# Patient Record
Sex: Female | Born: 1975 | Race: Asian | Hispanic: No | Marital: Married | Smoking: Never smoker
Health system: Southern US, Community
[De-identification: ages and names within clinical notes are randomized; demographics above are authoritative.]

## PROBLEM LIST (undated history)

## (undated) DIAGNOSIS — F419 Anxiety disorder, unspecified: Secondary | ICD-10-CM

## (undated) DIAGNOSIS — R519 Headache, unspecified: Secondary | ICD-10-CM

## (undated) DIAGNOSIS — E559 Vitamin D deficiency, unspecified: Secondary | ICD-10-CM

## (undated) DIAGNOSIS — U071 COVID-19: Secondary | ICD-10-CM

## (undated) DIAGNOSIS — R202 Paresthesia of skin: Secondary | ICD-10-CM

## (undated) DIAGNOSIS — K219 Gastro-esophageal reflux disease without esophagitis: Secondary | ICD-10-CM

## (undated) DIAGNOSIS — R1310 Dysphagia, unspecified: Secondary | ICD-10-CM

## (undated) DIAGNOSIS — L309 Dermatitis, unspecified: Secondary | ICD-10-CM

## (undated) DIAGNOSIS — E785 Hyperlipidemia, unspecified: Secondary | ICD-10-CM

## (undated) HISTORY — DX: Dermatitis, unspecified: L30.9

## (undated) HISTORY — DX: Hyperlipidemia, unspecified: E78.5

## (undated) HISTORY — DX: Vitamin D deficiency, unspecified: E55.9

## (undated) HISTORY — DX: Paresthesia of skin: R20.2

## (undated) HISTORY — DX: Anxiety disorder, unspecified: F41.9

## (undated) HISTORY — DX: COVID-19: U07.1

## (undated) HISTORY — DX: Dysphagia, unspecified: R13.10

## (undated) HISTORY — DX: Headache, unspecified: R51.9

---

## 1998-02-03 ENCOUNTER — Inpatient Hospital Stay (HOSPITAL_COMMUNITY): Admission: AD | Admit: 1998-02-03 | Discharge: 1998-02-03 | Payer: Self-pay | Admitting: *Deleted

## 1998-02-05 ENCOUNTER — Inpatient Hospital Stay (HOSPITAL_COMMUNITY): Admission: AD | Admit: 1998-02-05 | Discharge: 1998-02-05 | Payer: Self-pay | Admitting: Obstetrics and Gynecology

## 1998-02-22 ENCOUNTER — Inpatient Hospital Stay (HOSPITAL_COMMUNITY): Admission: AD | Admit: 1998-02-22 | Discharge: 1998-02-24 | Payer: Self-pay | Admitting: *Deleted

## 2000-01-25 ENCOUNTER — Encounter: Payer: Self-pay | Admitting: Family Medicine

## 2000-01-25 ENCOUNTER — Ambulatory Visit (HOSPITAL_COMMUNITY): Admission: RE | Admit: 2000-01-25 | Discharge: 2000-01-25 | Payer: Self-pay | Admitting: Family Medicine

## 2000-12-20 DIAGNOSIS — Z9289 Personal history of other medical treatment: Secondary | ICD-10-CM

## 2000-12-20 HISTORY — DX: Personal history of other medical treatment: Z92.89

## 2003-12-21 HISTORY — PX: LASIK: SHX215

## 2006-08-26 ENCOUNTER — Other Ambulatory Visit: Admission: RE | Admit: 2006-08-26 | Discharge: 2006-08-26 | Payer: Self-pay | Admitting: Family Medicine

## 2008-03-05 ENCOUNTER — Observation Stay (HOSPITAL_COMMUNITY): Admission: AD | Admit: 2008-03-05 | Discharge: 2008-03-06 | Payer: Self-pay | Admitting: Obstetrics and Gynecology

## 2008-04-22 ENCOUNTER — Inpatient Hospital Stay (HOSPITAL_COMMUNITY): Admission: AD | Admit: 2008-04-22 | Discharge: 2008-04-24 | Payer: Self-pay | Admitting: Obstetrics and Gynecology

## 2009-08-15 ENCOUNTER — Other Ambulatory Visit: Admission: RE | Admit: 2009-08-15 | Discharge: 2009-08-15 | Payer: Self-pay | Admitting: Family Medicine

## 2010-01-12 ENCOUNTER — Encounter: Admission: RE | Admit: 2010-01-12 | Discharge: 2010-01-12 | Payer: Self-pay | Admitting: Family Medicine

## 2011-01-10 ENCOUNTER — Encounter: Payer: Self-pay | Admitting: Obstetrics and Gynecology

## 2011-05-04 NOTE — Discharge Summary (Signed)
NAME:  Pamela Moreno, Pamela Moreno                     ACCOUNT NO.:  [id number]   MEDICAL RECORD NO.:  [id number]          PATIENT TYPE:  INP   LOCATION:  9131                          FACILITY:  [facility]   PHYSICIAN:  Zenaida Niece, M.D.DATE OF BIRTH:  1976-08-14   DATE OF ADMISSION:  04/22/2008  DATE OF DISCHARGE:  04/24/2008                               DISCHARGE SUMMARY   ADMISSION DIAGNOSIS:  Intrauterine pregnancy at 38 weeks.   DISCHARGE DIAGNOSIS:  Intrauterine pregnancy at 38 weeks.   PROCEDURES:  On Apr 22, 2008, she had a spontaneous vaginal delivery.   HISTORY AND PHYSICAL:  This is a 35 year old Asian female gravida 3,  para 2-0-0-2 with an EGA of 38 plus weeks who presents with a complaint  of regular contractions.  Evaluation in the office revealed cervix to be  4-5, 80, -2.  Prenatal care complicated by preterm dilation and  contractions, treated with intermittent Procardia.  She also wants  either tubal ligation or Essure.   PRENATAL LABS:  Blood type is O positive with a negative antibody  screen, RPR nonreactive, rubella immune, hepatitis B surface antigen  negative, HIV negative, gonorrhea and chlamydia negative, 1-hour Glucola  94, and group B strep is negative.   PAST OB HISTORY:  Two vaginal deliveries at term with rapid labors.  Baby's weight is 6 pounds 12 ounces and 6 pounds 9 ounces.   PAST MEDICAL AND SURGICAL HISTORY:  Negative.   ALLERGY:  To SULFA and possibly to LATEX.   PHYSICAL EXAM:  VITAL SIGNS: She is afebrile with stable vital signs.  Fetal heart tracing reactive with contractions every 3-5 minutes.  ABDOMEN: Gravid, nontender with an estimated fetal weight of 7 pounds.  Cervix is 5, 80, -1 vertex presentation.  Adequate pelvis and membranes  were ruptured revealing clear fluid.   HOSPITAL COURSE:  The patient was admitted and continued to contract on  her own.  Membranes were ruptured for augmentation.  She eventually  progressed to complete, pushed well,  and on the evening of Apr 22, 2008,  had a vaginal delivery of a viable female infant with Apgars of 9 and 9,  weight 6 pounds 11 ounces.  The baby delivered through a loose nuchal  cord x1 over a second-degree midline episiotomy.  Placenta delivered  spontaneously was intact.  Second-degree episiotomy repaired with 3-0  Vicryl.  Estimated blood loss 500 mL.  She did initially want to proceed  with tubal ligation on postpartum day #1.  She had no significant  postpartum problems.  Predelivery hemoglobin 13.2 and postdelivery is  12.2.  Due to difficulties in scheduling the tubal, she decided to wait  until after her postpartum visit and have Essure done in the office.  She had no further complications and on postpartum day #2 was felt to be  stable enough for discharge home.   DISCHARGE INSTRUCTIONS:  Regular diet, pelvic rest, followup in 6 weeks.   MEDICATIONS:  Over-the-counter ibuprofen and Tylenol p.r.n. and she is  given our discharge pamphlet.      Leighton Roach.  [place]isinger, M.D.  Electronically Signed     TDM/MEDQ  D:  04/24/2008  T:  04/24/2008  Job:  [id number]

## 2011-09-13 LAB — STREP B DNA PROBE: Strep Group B Ag: NEGATIVE

## 2011-09-13 LAB — CBC
HCT: 36.4
Hemoglobin: 12.8
MCHC: 35.1
MCV: 91.9
Platelets: 134 — ABNORMAL LOW
RBC: 3.96
RDW: 13.6
WBC: 14.8 — ABNORMAL HIGH

## 2011-09-13 LAB — RPR: RPR Ser Ql: NONREACTIVE

## 2014-06-18 ENCOUNTER — Other Ambulatory Visit: Payer: Self-pay | Admitting: Family Medicine

## 2014-06-18 ENCOUNTER — Other Ambulatory Visit (HOSPITAL_COMMUNITY)
Admission: RE | Admit: 2014-06-18 | Discharge: 2014-06-18 | Disposition: A | Discharge status: Home / Self Care | Payer: BC Managed Care – PPO | Source: Ambulatory Visit | Attending: Family Medicine | Admitting: Family Medicine

## 2014-06-18 DIAGNOSIS — Z0000 Encounter for general adult medical examination without abnormal findings: Secondary | ICD-10-CM | POA: Insufficient documentation

## 2014-06-19 LAB — CYTOLOGY - PAP

## 2015-01-06 ENCOUNTER — Ambulatory Visit
Admission: RE | Admit: 2015-01-06 | Discharge: 2015-01-06 | Disposition: A | Discharge status: Home / Self Care | Payer: BLUE CROSS/BLUE SHIELD | Source: Ambulatory Visit | Attending: Family Medicine | Admitting: Family Medicine

## 2015-01-06 ENCOUNTER — Other Ambulatory Visit: Payer: Self-pay | Admitting: Family Medicine

## 2015-01-06 DIAGNOSIS — R7611 Nonspecific reaction to tuberculin skin test without active tuberculosis: Secondary | ICD-10-CM

## 2015-01-06 DIAGNOSIS — R059 Cough, unspecified: Secondary | ICD-10-CM

## 2015-01-06 DIAGNOSIS — R05 Cough: Secondary | ICD-10-CM

## 2016-02-16 ENCOUNTER — Other Ambulatory Visit: Payer: Self-pay | Admitting: Family Medicine

## 2016-02-16 ENCOUNTER — Ambulatory Visit
Admission: RE | Admit: 2016-02-16 | Discharge: 2016-02-16 | Disposition: A | Discharge status: Home / Self Care | Payer: BLUE CROSS/BLUE SHIELD | Source: Ambulatory Visit | Attending: Family Medicine | Admitting: Family Medicine

## 2016-02-16 DIAGNOSIS — R05 Cough: Secondary | ICD-10-CM

## 2016-02-16 DIAGNOSIS — R059 Cough, unspecified: Secondary | ICD-10-CM

## 2016-03-24 DIAGNOSIS — J3089 Other allergic rhinitis: Secondary | ICD-10-CM | POA: Diagnosis not present

## 2016-03-24 DIAGNOSIS — K219 Gastro-esophageal reflux disease without esophagitis: Secondary | ICD-10-CM | POA: Diagnosis not present

## 2016-03-24 DIAGNOSIS — L509 Urticaria, unspecified: Secondary | ICD-10-CM | POA: Diagnosis not present

## 2016-03-24 DIAGNOSIS — J3081 Allergic rhinitis due to animal (cat) (dog) hair and dander: Secondary | ICD-10-CM | POA: Diagnosis not present

## 2016-03-24 DIAGNOSIS — J301 Allergic rhinitis due to pollen: Secondary | ICD-10-CM | POA: Diagnosis not present

## 2016-03-24 DIAGNOSIS — J309 Allergic rhinitis, unspecified: Secondary | ICD-10-CM | POA: Diagnosis not present

## 2016-03-24 DIAGNOSIS — R05 Cough: Secondary | ICD-10-CM | POA: Diagnosis not present

## 2016-03-30 DIAGNOSIS — J301 Allergic rhinitis due to pollen: Secondary | ICD-10-CM | POA: Diagnosis not present

## 2016-03-31 DIAGNOSIS — J3081 Allergic rhinitis due to animal (cat) (dog) hair and dander: Secondary | ICD-10-CM | POA: Diagnosis not present

## 2016-03-31 DIAGNOSIS — J3089 Other allergic rhinitis: Secondary | ICD-10-CM | POA: Diagnosis not present

## 2016-04-27 DIAGNOSIS — Z79899 Other long term (current) drug therapy: Secondary | ICD-10-CM | POA: Diagnosis not present

## 2016-04-27 DIAGNOSIS — K219 Gastro-esophageal reflux disease without esophagitis: Secondary | ICD-10-CM | POA: Diagnosis not present

## 2016-04-27 DIAGNOSIS — Z13220 Encounter for screening for lipoid disorders: Secondary | ICD-10-CM | POA: Diagnosis not present

## 2016-04-27 DIAGNOSIS — E559 Vitamin D deficiency, unspecified: Secondary | ICD-10-CM | POA: Diagnosis not present

## 2016-04-27 DIAGNOSIS — Z0000 Encounter for general adult medical examination without abnormal findings: Secondary | ICD-10-CM | POA: Diagnosis not present

## 2016-07-14 DIAGNOSIS — R6884 Jaw pain: Secondary | ICD-10-CM | POA: Diagnosis not present

## 2016-11-01 DIAGNOSIS — K219 Gastro-esophageal reflux disease without esophagitis: Secondary | ICD-10-CM | POA: Diagnosis not present

## 2016-11-01 DIAGNOSIS — L509 Urticaria, unspecified: Secondary | ICD-10-CM | POA: Diagnosis not present

## 2016-11-01 DIAGNOSIS — H1045 Other chronic allergic conjunctivitis: Secondary | ICD-10-CM | POA: Diagnosis not present

## 2016-11-01 DIAGNOSIS — R05 Cough: Secondary | ICD-10-CM | POA: Diagnosis not present

## 2016-11-05 DIAGNOSIS — Z6830 Body mass index (BMI) 30.0-30.9, adult: Secondary | ICD-10-CM | POA: Diagnosis not present

## 2016-11-05 DIAGNOSIS — Z23 Encounter for immunization: Secondary | ICD-10-CM | POA: Diagnosis not present

## 2016-11-05 DIAGNOSIS — Z1231 Encounter for screening mammogram for malignant neoplasm of breast: Secondary | ICD-10-CM | POA: Diagnosis not present

## 2016-11-05 DIAGNOSIS — Z01419 Encounter for gynecological examination (general) (routine) without abnormal findings: Secondary | ICD-10-CM | POA: Diagnosis not present

## 2016-11-30 DIAGNOSIS — J029 Acute pharyngitis, unspecified: Secondary | ICD-10-CM | POA: Diagnosis not present

## 2016-12-20 HISTORY — PX: OTHER SURGICAL HISTORY: SHX169

## 2016-12-28 DIAGNOSIS — T887XXA Unspecified adverse effect of drug or medicament, initial encounter: Secondary | ICD-10-CM | POA: Diagnosis not present

## 2016-12-28 DIAGNOSIS — R21 Rash and other nonspecific skin eruption: Secondary | ICD-10-CM | POA: Diagnosis not present

## 2017-01-13 DIAGNOSIS — R300 Dysuria: Secondary | ICD-10-CM | POA: Diagnosis not present

## 2017-07-22 DIAGNOSIS — L237 Allergic contact dermatitis due to plants, except food: Secondary | ICD-10-CM | POA: Diagnosis not present

## 2017-10-31 DIAGNOSIS — R51 Headache: Secondary | ICD-10-CM | POA: Diagnosis not present

## 2017-10-31 DIAGNOSIS — R350 Frequency of micturition: Secondary | ICD-10-CM | POA: Diagnosis not present

## 2017-10-31 DIAGNOSIS — H539 Unspecified visual disturbance: Secondary | ICD-10-CM | POA: Diagnosis not present

## 2017-10-31 DIAGNOSIS — M542 Cervicalgia: Secondary | ICD-10-CM | POA: Diagnosis not present

## 2017-10-31 DIAGNOSIS — Z23 Encounter for immunization: Secondary | ICD-10-CM | POA: Diagnosis not present

## 2017-11-01 ENCOUNTER — Ambulatory Visit
Admission: RE | Admit: 2017-11-01 | Discharge: 2017-11-01 | Disposition: A | Discharge status: Home / Self Care | Payer: BLUE CROSS/BLUE SHIELD | Source: Ambulatory Visit | Attending: Family Medicine | Admitting: Family Medicine

## 2017-11-01 ENCOUNTER — Other Ambulatory Visit: Payer: Self-pay | Admitting: Family Medicine

## 2017-11-01 DIAGNOSIS — M542 Cervicalgia: Secondary | ICD-10-CM

## 2017-11-01 DIAGNOSIS — M50323 Other cervical disc degeneration at C6-C7 level: Secondary | ICD-10-CM | POA: Diagnosis not present

## 2017-11-08 DIAGNOSIS — K219 Gastro-esophageal reflux disease without esophagitis: Secondary | ICD-10-CM | POA: Diagnosis not present

## 2017-11-08 DIAGNOSIS — R102 Pelvic and perineal pain: Secondary | ICD-10-CM | POA: Diagnosis not present

## 2017-11-08 DIAGNOSIS — M545 Low back pain: Secondary | ICD-10-CM | POA: Diagnosis not present

## 2018-01-19 DIAGNOSIS — H52203 Unspecified astigmatism, bilateral: Secondary | ICD-10-CM | POA: Diagnosis not present

## 2018-01-19 DIAGNOSIS — H5213 Myopia, bilateral: Secondary | ICD-10-CM | POA: Diagnosis not present

## 2018-01-19 DIAGNOSIS — H524 Presbyopia: Secondary | ICD-10-CM | POA: Diagnosis not present

## 2018-02-22 DIAGNOSIS — Z6832 Body mass index (BMI) 32.0-32.9, adult: Secondary | ICD-10-CM | POA: Diagnosis not present

## 2018-02-22 DIAGNOSIS — Z01419 Encounter for gynecological examination (general) (routine) without abnormal findings: Secondary | ICD-10-CM | POA: Diagnosis not present

## 2018-02-22 DIAGNOSIS — Z1231 Encounter for screening mammogram for malignant neoplasm of breast: Secondary | ICD-10-CM | POA: Diagnosis not present

## 2018-04-04 ENCOUNTER — Encounter (HOSPITAL_COMMUNITY): Payer: Self-pay | Admitting: Emergency Medicine

## 2018-04-04 ENCOUNTER — Ambulatory Visit (HOSPITAL_COMMUNITY)
Admission: EM | Admit: 2018-04-04 | Discharge: 2018-04-04 | Disposition: A | Discharge status: Home / Self Care | Payer: BLUE CROSS/BLUE SHIELD | Attending: Family Medicine | Admitting: Family Medicine

## 2018-04-04 DIAGNOSIS — J029 Acute pharyngitis, unspecified: Secondary | ICD-10-CM

## 2018-04-04 DIAGNOSIS — R0989 Other specified symptoms and signs involving the circulatory and respiratory systems: Secondary | ICD-10-CM

## 2018-04-04 DIAGNOSIS — R0789 Other chest pain: Secondary | ICD-10-CM

## 2018-04-04 DIAGNOSIS — F458 Other somatoform disorders: Secondary | ICD-10-CM

## 2018-04-04 DIAGNOSIS — R198 Other specified symptoms and signs involving the digestive system and abdomen: Secondary | ICD-10-CM

## 2018-04-04 MED ORDER — GI COCKTAIL ~~LOC~~
ORAL | Status: AC
  Filled 2018-04-04: qty 30

## 2018-04-04 MED ORDER — GI COCKTAIL ~~LOC~~
30.0000 mL | Freq: Once | ORAL | Status: AC
  Administered 2018-04-04: 30 mL via ORAL

## 2018-04-04 MED ORDER — ESOMEPRAZOLE MAGNESIUM 40 MG PO CPDR: 40.0000 mg | DELAYED_RELEASE_CAPSULE | Freq: Every day | ORAL | 0 refills | Status: DC

## 2018-04-04 NOTE — ED Triage Notes (Signed)
Pt sts chest tightness and sore throat; pt sts hx of GERD and unsure if same

## 2018-04-04 NOTE — ED Provider Notes (Signed)
[facility]    CSN: [id number] Arrival date & time: 04/04/18  1743     History   Chief Complaint Chief Complaint  Patient presents with  . Chest Pain  . Sore Throat    HPI Pamela Moreno is a 42 y.o. female.   42 year old female comes in for 1 week history of chest tightness and globus sensation.  Chest tightness is constant, worse with lying down.  Denies shortness of breath, wheezing, palpitations.  Globus sensation is also constant, no aggravating or alleviating factor.   Denies nausea, vomiting, abdominal pain.  Denies URI symptoms such as cough, congestion, throat pain.  Denies fever, chills, night sweats.  Denies swelling of the throat, trouble breathing, trouble swallowing.  Has a history of GERD, takes pantoprazole for many years.  States she felt her symptoms started due to drinking caffeine, and started caffeine without improvement.  She has still been eating and drinking without problems, does eat spicy foods, but denies any increase in symptoms after spicy food.  Denies increase in symptoms with exertion.  Has not noticed DOE, or having to take breaks with exertion.  Denies personal history of HTN, DM, heart disease, asthma.  Never smoker.  Denies family history of heart disease.      History reviewed. No pertinent past medical history.  There are no active problems to display for this patient.   History reviewed. No pertinent surgical history.  OB History   None      Home Medications    Prior to Admission medications   Medication Sig Start Date End Date Taking? Authorizing Provider  esomeprazole (NEXIUM) 40 MG capsule Take 1 capsule (40 mg total) by mouth daily. 04/04/18   Belinda Fisher, PA-C    Family History History reviewed. No pertinent family history.  Social History Social History   Tobacco Use  . Smoking status: Never Smoker  . Smokeless tobacco: Never Used  Substance Use Topics  . Alcohol use: Never    Frequency: Never  . Drug use: Never       Allergies   Patient has no known allergies.   Review of Systems Review of Systems  Reason unable to perform ROS: See HPI as above.     Physical Exam Triage Vital Signs ED Triage Vitals [04/04/18 1804]  Enc Vitals Group     BP 123/76     Pulse Rate 64     Resp 18     Temp 98.4 F (36.9 C)     Temp Source Oral     SpO2 98 %     Weight      Height      Head Circumference      Peak Flow      Pain Score      Pain Loc      Pain Edu?      Excl. in GC?    No data found.  Updated Vital Signs BP 123/76 (BP Location: Right Arm)   Pulse 64   Temp 98.4 F (36.9 C) (Oral)   Resp 18   SpO2 98%   Physical Exam  Constitutional: She is oriented to person, place, and time. She appears well-developed and well-nourished.  Non-toxic appearance. She does not appear ill. No distress.  HENT:  Head: Normocephalic and atraumatic.  Right Ear: Tympanic membrane, external ear and ear canal normal. Tympanic membrane is not erythematous and not bulging.  Left Ear: Tympanic membrane, external ear and ear canal normal. Tympanic membrane  is not erythematous and not bulging.  Nose: Nose normal. Right sinus exhibits no maxillary sinus tenderness and no frontal sinus tenderness. Left sinus exhibits no maxillary sinus tenderness and no frontal sinus tenderness.  Mouth/Throat: Uvula is midline, oropharynx is clear and moist and mucous membranes are normal.  Eyes: Pupils are equal, round, and reactive to light. Conjunctivae are normal.  Neck: Normal range of motion. Neck supple. No tracheal deviation present. No thyromegaly present.  Cardiovascular: Normal rate, regular rhythm and normal heart sounds. Exam reveals no gallop and no friction rub.  No murmur heard. Pulmonary/Chest: Effort normal and breath sounds normal. No accessory muscle usage or stridor. No tachypnea. No respiratory distress. She has no decreased breath sounds. She has no wheezes. She has no rhonchi. She has no rales.   Abdominal: Soft. Bowel sounds are normal. She exhibits no distension. There is no tenderness. There is no rebound and no guarding.  Musculoskeletal:       Right lower leg: She exhibits no edema.       Left lower leg: She exhibits no edema.  Lymphadenopathy:    She has no cervical adenopathy.  Neurological: She is alert and oriented to person, place, and time. She is not disoriented.  Skin: Skin is warm and dry.  Psychiatric: She has a normal mood and affect. Her behavior is normal. Judgment normal. Her mood appears not anxious. She is not agitated.     UC Treatments / Results  Labs (all labs ordered are listed, but only abnormal results are displayed) Labs Reviewed - No data to display  EKG None Radiology No results found.  Procedures Procedures (including critical care time)  Medications Ordered in UC Medications  gi cocktail (Maalox,Lidocaine,Donnatal) (30 mLs Oral Given 04/04/18 1847)     Initial Impression / Assessment and Plan / UC Course  I have reviewed the triage vital signs and the nursing notes.  Pertinent labs & imaging results that were available during my care of the patient were reviewed by me and considered in my medical decision making (see chart for details).    Patient with improvement of globus sensation after GI cocktail. EKG NSR, 67 bpm, without acute ST changes. Lungs clear to auscultation bilaterally without adventitious lung sounds. Will switch patient to nexium, foods to avoid for GERD provided to patient. Patient to follow up with PCP for further evaluation if symptoms not improving. Return precautions given. Patient expresses understanding and agrees to plan.   Final Clinical Impressions(s) / UC Diagnoses   Final diagnoses:  Globus sensation    ED Discharge Orders        Ordered    esomeprazole (NEXIUM) 40 MG capsule  Daily     04/04/18 1901        Belinda FisherYu, Amy V, PA-C 04/04/18 1909

## 2018-04-04 NOTE — Discharge Instructions (Signed)
EKG normal. Discontinue pantoprazole for now. Start nexium as directed.  I have attached information on foods to avoid for now.  Avoid spicy food and caffeine.  Sit up straight 30-60 minutes after eating.  Do not eat 3 hours prior to sleeping.  Follow-up with PCP for further evaluation if symptoms not improving.  If experiencing worsening in symptoms, chest pain, shortness of breath, wheezing, weakness, dizziness, passing out, go to the emergency department for further evaluation.

## 2018-05-22 DIAGNOSIS — Z79899 Other long term (current) drug therapy: Secondary | ICD-10-CM | POA: Diagnosis not present

## 2018-05-22 DIAGNOSIS — Z0000 Encounter for general adult medical examination without abnormal findings: Secondary | ICD-10-CM | POA: Diagnosis not present

## 2018-05-22 DIAGNOSIS — E559 Vitamin D deficiency, unspecified: Secondary | ICD-10-CM | POA: Diagnosis not present

## 2018-05-22 DIAGNOSIS — Z13220 Encounter for screening for lipoid disorders: Secondary | ICD-10-CM | POA: Diagnosis not present

## 2018-05-22 DIAGNOSIS — K219 Gastro-esophageal reflux disease without esophagitis: Secondary | ICD-10-CM | POA: Diagnosis not present

## 2018-06-19 ENCOUNTER — Other Ambulatory Visit: Payer: Self-pay | Admitting: Gastroenterology

## 2018-06-19 DIAGNOSIS — R1310 Dysphagia, unspecified: Secondary | ICD-10-CM | POA: Diagnosis not present

## 2018-06-19 DIAGNOSIS — K219 Gastro-esophageal reflux disease without esophagitis: Secondary | ICD-10-CM | POA: Diagnosis not present

## 2018-06-23 ENCOUNTER — Ambulatory Visit
Admission: RE | Admit: 2018-06-23 | Discharge: 2018-06-23 | Disposition: A | Discharge status: Home / Self Care | Payer: BLUE CROSS/BLUE SHIELD | Source: Ambulatory Visit | Attending: Gastroenterology | Admitting: Gastroenterology

## 2018-06-23 DIAGNOSIS — R1314 Dysphagia, pharyngoesophageal phase: Secondary | ICD-10-CM | POA: Diagnosis not present

## 2018-06-23 DIAGNOSIS — R1310 Dysphagia, unspecified: Secondary | ICD-10-CM

## 2018-06-23 DIAGNOSIS — K449 Diaphragmatic hernia without obstruction or gangrene: Secondary | ICD-10-CM | POA: Diagnosis not present

## 2018-07-27 DIAGNOSIS — R1310 Dysphagia, unspecified: Secondary | ICD-10-CM | POA: Diagnosis not present

## 2018-07-27 DIAGNOSIS — K219 Gastro-esophageal reflux disease without esophagitis: Secondary | ICD-10-CM | POA: Diagnosis not present

## 2018-10-16 DIAGNOSIS — M7711 Lateral epicondylitis, right elbow: Secondary | ICD-10-CM | POA: Diagnosis not present

## 2018-10-16 DIAGNOSIS — Z23 Encounter for immunization: Secondary | ICD-10-CM | POA: Diagnosis not present

## 2018-12-11 DIAGNOSIS — M7711 Lateral epicondylitis, right elbow: Secondary | ICD-10-CM | POA: Diagnosis not present

## 2019-01-01 DIAGNOSIS — M25521 Pain in right elbow: Secondary | ICD-10-CM | POA: Diagnosis not present

## 2019-03-14 DIAGNOSIS — R42 Dizziness and giddiness: Secondary | ICD-10-CM | POA: Diagnosis not present

## 2019-03-14 DIAGNOSIS — J301 Allergic rhinitis due to pollen: Secondary | ICD-10-CM | POA: Diagnosis not present

## 2019-04-19 DIAGNOSIS — Z6831 Body mass index (BMI) 31.0-31.9, adult: Secondary | ICD-10-CM | POA: Diagnosis not present

## 2019-04-19 DIAGNOSIS — Z01419 Encounter for gynecological examination (general) (routine) without abnormal findings: Secondary | ICD-10-CM | POA: Diagnosis not present

## 2019-04-19 DIAGNOSIS — Z1231 Encounter for screening mammogram for malignant neoplasm of breast: Secondary | ICD-10-CM | POA: Diagnosis not present

## 2019-05-07 DIAGNOSIS — E669 Obesity, unspecified: Secondary | ICD-10-CM | POA: Diagnosis not present

## 2019-05-07 DIAGNOSIS — R300 Dysuria: Secondary | ICD-10-CM | POA: Diagnosis not present

## 2019-05-07 DIAGNOSIS — M25561 Pain in right knee: Secondary | ICD-10-CM | POA: Diagnosis not present

## 2019-05-07 DIAGNOSIS — N3090 Cystitis, unspecified without hematuria: Secondary | ICD-10-CM | POA: Diagnosis not present

## 2019-10-01 ENCOUNTER — Other Ambulatory Visit: Payer: Self-pay

## 2019-10-01 ENCOUNTER — Ambulatory Visit
Admission: EM | Admit: 2019-10-01 | Discharge: 2019-10-01 | Disposition: A | Discharge status: Home / Self Care | Payer: BC Managed Care – PPO | Attending: Emergency Medicine | Admitting: Emergency Medicine

## 2019-10-01 ENCOUNTER — Encounter: Payer: Self-pay | Admitting: Emergency Medicine

## 2019-10-01 DIAGNOSIS — K449 Diaphragmatic hernia without obstruction or gangrene: Secondary | ICD-10-CM | POA: Diagnosis not present

## 2019-10-01 DIAGNOSIS — K219 Gastro-esophageal reflux disease without esophagitis: Secondary | ICD-10-CM | POA: Diagnosis not present

## 2019-10-01 HISTORY — DX: Gastro-esophageal reflux disease without esophagitis: K21.9

## 2019-10-01 MED ORDER — ALUM & MAG HYDROXIDE-SIMETH 200-200-20 MG/5ML PO SUSP
30.0000 mL | Freq: Once | ORAL | Status: AC
  Administered 2019-10-01: 30 mL via ORAL

## 2019-10-01 MED ORDER — LIDOCAINE VISCOUS HCL 2 % MT SOLN
15.0000 mL | Freq: Once | OROMUCOSAL | Status: AC
  Administered 2019-10-01: 15 mL via ORAL

## 2019-10-01 NOTE — ED Provider Notes (Signed)
[facility]    CSN: [id number] Arrival date & time: 10/01/19  1155      History   Chief Complaint Chief Complaint  Patient presents with  . Chest Pain    HPI Pamela Moreno is a 43 y.o. female with history of GERD presenting for 1 week course of intermittent retrosternal chest pain, one episode of nausea without emesis.  Patient also notes infrequent dizziness that is worse with standing.  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  Denies change in vision or LOC during these episodes.  Patient followed routinely by PCP who was done barium swallow study on 06/23/18 which was reviewed by me at time of appointment: Significant for normal motility with very small sliding-type hiatal hernia and inducible GE reflux without mass, stricture.  Patient reports compliance with PPI.  Has not tried anything for her retrosternal chest discomfort.   Past Medical History:  Diagnosis Date  . GERD (gastroesophageal reflux disease)     There are no active problems to display for this patient.   History reviewed. No pertinent surgical history.  OB History   No obstetric history on file.      Home Medications    Prior to Admission medications   Medication Sig Start Date End Date Taking? Authorizing Provider  esomeprazole (NEXIUM) 40 MG capsule Take 1 capsule (40 mg total) by mouth daily. 04/04/18   Belinda Fisher, PA-C    Family History History reviewed. No pertinent family history.  Social History Social History   Tobacco Use  . Smoking status: Never Smoker  . Smokeless tobacco: Never Used  Substance Use Topics  . Alcohol use: Never    Frequency: Never  . Drug use: Never     Allergies   Penicillins and Sulfa antibiotics   Review of Systems Review of Systems  Constitutional: Negative for fatigue and fever.  HENT: Negative for ear pain, sinus pain, sore throat and voice change.   Eyes: Negative for pain, redness and visual  disturbance.  Respiratory: Negative for cough and shortness of breath.   Cardiovascular: Negative for chest pain and palpitations.  Gastrointestinal:       Positive for epigastric pain, heartburn, nausea without emesis  Musculoskeletal: Negative for arthralgias and myalgias.  Skin: Negative for rash and wound.  Neurological: Negative for syncope and headaches.     Physical Exam Triage Vital Signs ED Triage Vitals  Enc Vitals Group     BP 10/01/19 1207 128/81     Pulse Rate 10/01/19 1207 66     Resp 10/01/19 1207 20     Temp 10/01/19 1207 98 F (36.7 C)     Temp Source 10/01/19 1207 Temporal     SpO2 10/01/19 1207 97 %     Weight --      Height --      Head Circumference --      Peak Flow --      Pain Score 10/01/19 1208 0     Pain Loc --      Pain Edu? --      Excl. in GC? --    No data found.  Updated Vital Signs BP 128/81 (BP Location: Right Arm)   Pulse 66   Temp 98 F (36.7 C) (Temporal)   Resp 20   LMP 09/28/2019   SpO2 97%   Visual Acuity Right Eye Distance:   Left Eye Distance:   Bilateral Distance:  Right Eye Near:   Left Eye Near:    Bilateral Near:     Physical Exam Constitutional:      General: She is not in acute distress.    Appearance: She is not ill-appearing.  HENT:     Head: Normocephalic and atraumatic.     Mouth/Throat:     Mouth: Mucous membranes are moist.     Pharynx: Oropharynx is clear.  Eyes:     General: No scleral icterus.    Pupils: Pupils are equal, round, and reactive to light.  Cardiovascular:     Rate and Rhythm: Normal rate and regular rhythm.     Heart sounds: No murmur. No gallop.   Pulmonary:     Effort: Pulmonary effort is normal. No respiratory distress.     Breath sounds: No wheezing.  Abdominal:     General: Abdomen is flat. Bowel sounds are normal. There is no distension.     Palpations: Abdomen is soft.     Tenderness: There is no guarding.     Hernia: No hernia is present.     Comments: Epigastric  tenderness without rebound or mass appreciated  Skin:    Coloration: Skin is not jaundiced or pale.  Neurological:     Mental Status: She is alert and oriented to person, place, and time.      UC Treatments / Results  Labs (all labs ordered are listed, but only abnormal results are displayed) Labs Reviewed - No data to display  EKG   Radiology No results found.  Procedures Procedures (including critical care time)  Medications Ordered in UC Medications  alum & mag hydroxide-simeth (MAALOX/MYLANTA) 200-200-20 MG/5ML suspension 30 mL (30 mLs Oral Given 10/01/19 1230)    And  lidocaine (XYLOCAINE) 2 % viscous mouth solution 15 mL (15 mLs Oral Given 10/01/19 1230)    Initial Impression / Assessment and Plan / UC Course  I have reviewed the triage vital signs and the nursing notes.  Pertinent labs & imaging results that were available during my care of the patient were reviewed by me and considered in my medical decision making (see chart for details).     Patient given GI cocktail with viscous lidocaine which she tolerated well and reports improvement of symptoms.  Discussed that hiatal hernia and GERD can be contributing to symptoms.  Low concern for cardiac etiology given hemodynamic stability, lack of cardiopulmonary findings in conjunction with normal EKG on 04/04/2018 when she had similar symptoms.  Patient to continue PPI, follow-up with PCP/possible specialist as outlined below.  Return precautions discussed, patient verbalized understanding and is agreeable to plan. Final Clinical Impressions(s) / UC Diagnoses   Final diagnoses:  Gastroesophageal reflux disease, unspecified whether esophagitis present  Hiatal hernia     Discharge Instructions     Continue taking your use omeprazole daily as prescribed by your PCP. Recommend eating small meals, avoiding eating 2 to 3 hours before bedtime. May need to prop up pillows, or head of bed for additional pain relief. Call  PCP tomorrow, inform them of today's visit: You may need to see gastroenterologist (GI specialist) for further evaluation of your chronic GERD/hiatal hernia. Go to ER for worsening chest pain, shortness of breath, lightheadedness, loss of consciousness.    ED Prescriptions    None     PDMP not reviewed this encounter.   Neldon Mc [place], [place] 10/01/19 1950

## 2019-10-01 NOTE — Discharge Instructions (Addendum)
Continue taking your use omeprazole daily as prescribed by your PCP. Recommend eating small meals, avoiding eating 2 to 3 hours before bedtime. May need to prop up pillows, or head of bed for additional pain relief. Call PCP tomorrow, inform them of today's visit: You may need to see gastroenterologist (GI specialist) for further evaluation of your chronic GERD/hiatal hernia. Go to ER for worsening chest pain, shortness of breath, lightheadedness, loss of consciousness.

## 2019-10-01 NOTE — ED Notes (Signed)
Patient able to ambulate independently  

## 2019-10-01 NOTE — ED Triage Notes (Signed)
Pt presents to [facility] for assessment of chest pain with shoulder movement (either forward or backwards) x 1 week with nausea and dizziness.  Denies any heavy lifting or injury.  Denies SOB.

## 2019-11-12 DIAGNOSIS — M25562 Pain in left knee: Secondary | ICD-10-CM | POA: Diagnosis not present

## 2019-11-12 DIAGNOSIS — M25561 Pain in right knee: Secondary | ICD-10-CM | POA: Diagnosis not present

## 2019-11-26 ENCOUNTER — Other Ambulatory Visit: Payer: Self-pay | Admitting: Cardiology

## 2019-11-26 DIAGNOSIS — Z20822 Contact with and (suspected) exposure to covid-19: Secondary | ICD-10-CM

## 2019-11-27 LAB — NOVEL CORONAVIRUS, NAA: SARS-CoV-2, NAA: NOT DETECTED

## 2020-02-17 ENCOUNTER — Ambulatory Visit (HOSPITAL_COMMUNITY)
Admission: EM | Admit: 2020-02-17 | Discharge: 2020-02-17 | Disposition: A | Discharge status: Home / Self Care | Payer: BC Managed Care – PPO | Attending: Family Medicine | Admitting: Family Medicine

## 2020-02-17 ENCOUNTER — Encounter (HOSPITAL_COMMUNITY): Payer: Self-pay

## 2020-02-17 ENCOUNTER — Ambulatory Visit (INDEPENDENT_AMBULATORY_CARE_PROVIDER_SITE_OTHER): Payer: BC Managed Care – PPO

## 2020-02-17 ENCOUNTER — Other Ambulatory Visit: Payer: Self-pay

## 2020-02-17 DIAGNOSIS — U071 COVID-19: Secondary | ICD-10-CM

## 2020-02-17 DIAGNOSIS — R0602 Shortness of breath: Secondary | ICD-10-CM

## 2020-02-17 DIAGNOSIS — J1282 Pneumonia due to Coronavirus disease 2019: Secondary | ICD-10-CM

## 2020-02-17 MED ORDER — DEXAMETHASONE 6 MG PO TABS: 6.0000 mg | ORAL_TABLET | Freq: Every day | ORAL | 0 refills | Status: DC

## 2020-02-17 MED ORDER — BENZONATATE 200 MG PO CAPS: 200.0000 mg | ORAL_CAPSULE | Freq: Two times a day (BID) | ORAL | 0 refills | Status: DC | PRN

## 2020-02-17 NOTE — ED Provider Notes (Signed)
[facility]    CSN: [id number] Arrival date & time: 02/17/20  1639      History   Chief Complaint Chief Complaint  Patient presents with  . Shortness of Breath    HPI Pamela Moreno is a 44 y.o. female.   HPI  Patient developed some respiratory symptoms on 02/11/2020.  She had Covid testing that was positive.  She states that last night her shortness of breath became acutely worse.  Her coughing is worse.  She feels very tired.  Body aches.  Mild runny nose.  Mild headache.  Some sweats and chills.  No loss of taste or smell.  Appetite is poor.  No dizziness.  She does not have any underlying lung disease, respiratory problems, cigarette smoking.  Past Medical History:  Diagnosis Date  . GERD (gastroesophageal reflux disease)     There are no problems to display for this patient.   History reviewed. No pertinent surgical history.  OB History   No obstetric history on file.      Home Medications    Prior to Admission medications   Medication Sig Start Date End Date Taking? Authorizing Provider  benzonatate (TESSALON) 200 MG capsule Take 1 capsule (200 mg total) by mouth 2 (two) times daily as needed for cough. 02/17/20   Eustace Moore, MD  dexamethasone (DECADRON) 6 MG tablet Take 1 tablet (6 mg total) by mouth daily. 02/17/20   Eustace Moore, MD  pantoprazole (PROTONIX) 40 MG tablet Take 40 mg by mouth daily. 01/23/20   [provider]  esomeprazole (NEXIUM) 40 MG capsule Take 1 capsule (40 mg total) by mouth daily. 04/04/18 02/17/20  Belinda Fisher, PA-C  omeprazole (PRILOSEC) 10 MG capsule Take by mouth.  02/17/20  [provider]    Family History Family History  Problem Relation Age of Onset  . Cancer Mother   . Hypertension Father   . Hyperlipidemia Father     Social History Social History   Tobacco Use  . Smoking status: Never Smoker  . Smokeless tobacco: Never Used  Substance Use Topics  . Alcohol use: Never  . Drug use:  Never     Allergies   Penicillins and Sulfa antibiotics   Review of Systems Review of Systems  Constitutional: Positive for fatigue. Negative for chills and fever.  HENT: Positive for congestion and rhinorrhea. Negative for nosebleeds.   Respiratory: Positive for cough and shortness of breath.   Gastrointestinal: Negative for nausea and vomiting.  Musculoskeletal: Positive for myalgias.  Neurological: Negative for headaches.     Physical Exam Triage Vital Signs ED Triage Vitals  Enc Vitals Group     BP 02/17/20 1751 112/81     Pulse Rate 02/17/20 1751 87     Resp 02/17/20 1751 (!) 21     Temp 02/17/20 1751 99.3 F (37.4 C)     Temp Source 02/17/20 1751 Oral     SpO2 02/17/20 1751 100 %     Weight 02/17/20 1746 181 lb 6.4 oz (82.3 kg)     Height --      Head Circumference --      Peak Flow --      Pain Score 02/17/20 1746 0     Pain Loc --      Pain Edu? --      Excl. in GC? --    No data found.  Updated Vital Signs BP (!) 122/49 (BP Location: Left Arm)   Pulse  81   Temp 99.4 F (37.4 C) (Oral)   Resp (!) 21   Wt 82.3 kg   LMP 01/17/2020 (Exact Date) Comment: denies pregnancy  SpO2 100%      Physical Exam Constitutional:      General: She is not in acute distress.    Appearance: She is well-developed. She is ill-appearing.     Comments: Appears tired.  Dyspneic with conversation  HENT:     Head: Normocephalic and atraumatic.     Mouth/Throat:     Comments: Mask in place Eyes:     Conjunctiva/sclera: Conjunctivae normal.     Pupils: Pupils are equal, round, and reactive to light.  Cardiovascular:     Rate and Rhythm: Normal rate and regular rhythm.     Heart sounds: Normal heart sounds.  Pulmonary:     Effort: Pulmonary effort is normal. No respiratory distress.     Breath sounds: No wheezing or rales.     Comments: Diminished breath sounds Musculoskeletal:        General: Normal range of motion.     Cervical back: Normal range of motion.    Lymphadenopathy:     Cervical: No cervical adenopathy.  Skin:    General: Skin is warm and dry.  Neurological:     Mental Status: She is alert.  Psychiatric:        Mood and Affect: Mood normal.        Behavior: Behavior normal.      UC Treatments / Results  Labs (all labs ordered are listed, but only abnormal results are displayed) Labs Reviewed - No data to display  EKG   Radiology DG Chest 2 View  Result Date: 02/17/2020 CLINICAL DATA:  44 year old female with shortness of breath. Positive COVID-19 EXAM: CHEST - 2 VIEW COMPARISON:  Chest radiograph dated 02/16/2016. FINDINGS: Faint peripheral and subpleural hazy densities primarily in the left lower lung field most consistent with pneumonia, likely viral and atypical in etiology and in keeping with COVID-19. Clinical correlation and follow-up recommended. No lobar consolidation, pleural effusion or pneumothorax. The cardiac silhouette is within normal limits. No acute osseous pathology. IMPRESSION: Findings most consistent with COVID-19. Clinical correlation and follow-up recommended. Electronically Signed   By: Anner Crete M.D.   On: 02/17/2020 18:35    Procedures Procedures (including critical care time)  Medications Ordered in UC Medications - No data to display  Initial Impression / Assessment and Plan / UC Course  I have reviewed the triage vital signs and the nursing notes.  Pertinent labs & imaging results that were available during my care of the patient were reviewed by me and considered in my medical decision making (see chart for details).     Reviewed the patient that Covid pneumonia is not unusual with Covid.  She does not have physical exam findings or vital signs that would warrant hospitalization.  If she gets worse instead of better, however, since she has had a fairly rapid increase in her symptoms, would recommend ER visit She has had symptoms for 6 days.  I do not think she needs criteria for  infusion therapy Final Clinical Impressions(s) / UC Diagnoses   Final diagnoses:  SOB (shortness of breath)  COVID-19  Pneumonia due to COVID-19 virus     Discharge Instructions     Quarantine at home Call for problems If you become more short of breath, or more dizzy or weak, go to ER Take the dexamethasone daily Take tessalon for cough  ED Prescriptions    Medication Sig Dispense Auth. Provider   dexamethasone (DECADRON) 6 MG tablet Take 1 tablet (6 mg total) by mouth daily. 10 tablet Eustace Moore, MD   benzonatate (TESSALON) 200 MG capsule Take 1 capsule (200 mg total) by mouth 2 (two) times daily as needed for cough. 20 capsule Eustace Moore, MD     PDMP not reviewed this encounter.   Eustace Moore, MD 02/17/20 Jerene Bears

## 2020-02-17 NOTE — Discharge Instructions (Addendum)
Quarantine at home Call for problems If you become more short of breath, or more dizzy or weak, go to ER Take the dexamethasone daily Take tessalon for cough

## 2020-02-17 NOTE — ED Triage Notes (Signed)
Pt is here with SOB that started last night, she tested POSITIVE for COVID on 02/11/2020. Pt has taken Tylenol & Aleve to relieve discomfort.

## 2020-02-18 ENCOUNTER — Emergency Department (HOSPITAL_COMMUNITY)
Admission: EM | Admit: 2020-02-18 | Discharge: 2020-02-18 | Disposition: A | Discharge status: Home / Self Care | Payer: BC Managed Care – PPO | Attending: Emergency Medicine | Admitting: Emergency Medicine

## 2020-02-18 ENCOUNTER — Emergency Department (HOSPITAL_COMMUNITY): Payer: BC Managed Care – PPO

## 2020-02-18 ENCOUNTER — Other Ambulatory Visit: Payer: Self-pay

## 2020-02-18 ENCOUNTER — Encounter (HOSPITAL_COMMUNITY): Payer: Self-pay | Admitting: Emergency Medicine

## 2020-02-18 DIAGNOSIS — U071 COVID-19: Secondary | ICD-10-CM | POA: Diagnosis not present

## 2020-02-18 DIAGNOSIS — Z79899 Other long term (current) drug therapy: Secondary | ICD-10-CM | POA: Diagnosis not present

## 2020-02-18 DIAGNOSIS — R0602 Shortness of breath: Secondary | ICD-10-CM | POA: Diagnosis not present

## 2020-02-18 LAB — CBC WITH DIFFERENTIAL/PLATELET
Abs Immature Granulocytes: 0.04 10*3/uL (ref 0.00–0.07)
Basophils Absolute: 0 10*3/uL (ref 0.0–0.1)
Basophils Relative: 0 %
Eosinophils Absolute: 0 10*3/uL (ref 0.0–0.5)
Eosinophils Relative: 0 %
HCT: 39.9 % (ref 36.0–46.0)
Hemoglobin: 13.1 g/dL (ref 12.0–15.0)
Immature Granulocytes: 1 %
Lymphocytes Relative: 23 %
Lymphs Abs: 1.7 10*3/uL (ref 0.7–4.0)
MCH: 27.8 pg (ref 26.0–34.0)
MCHC: 32.8 g/dL (ref 30.0–36.0)
MCV: 84.5 fL (ref 80.0–100.0)
Monocytes Absolute: 0.3 10*3/uL (ref 0.1–1.0)
Monocytes Relative: 4 %
Neutro Abs: 5.4 10*3/uL (ref 1.7–7.7)
Neutrophils Relative %: 72 %
Platelets: 158 10*3/uL (ref 150–400)
RBC: 4.72 MIL/uL (ref 3.87–5.11)
RDW: 13.2 % (ref 11.5–15.5)
WBC: 7.4 10*3/uL (ref 4.0–10.5)
nRBC: 0 % (ref 0.0–0.2)

## 2020-02-18 LAB — COMPREHENSIVE METABOLIC PANEL
ALT: 43 U/L (ref 0–44)
AST: 50 U/L — ABNORMAL HIGH (ref 15–41)
Albumin: 3.2 g/dL — ABNORMAL LOW (ref 3.5–5.0)
Alkaline Phosphatase: 69 U/L (ref 38–126)
Anion gap: 11 (ref 5–15)
BUN: 7 mg/dL (ref 6–20)
CO2: 20 mmol/L — ABNORMAL LOW (ref 22–32)
Calcium: 8.9 mg/dL (ref 8.9–10.3)
Chloride: 108 mmol/L (ref 98–111)
Creatinine, Ser: 0.61 mg/dL (ref 0.44–1.00)
GFR calc Af Amer: >60 mL/min (ref >60)
GFR calc non Af Amer: >60 mL/min (ref >60)
Glucose, Bld: 193 mg/dL — ABNORMAL HIGH (ref 70–99)
Potassium: 3.9 mmol/L (ref 3.5–5.1)
Sodium: 139 mmol/L (ref 135–145)
Total Bilirubin: 0.3 mg/dL (ref 0.3–1.2)
Total Protein: 7.2 g/dL (ref 6.5–8.1)

## 2020-02-18 MED ORDER — ALBUTEROL SULFATE HFA 108 (90 BASE) MCG/ACT IN AERS
2.0000 | INHALATION_SPRAY | Freq: Once | RESPIRATORY_TRACT | Status: AC
  Administered 2020-02-18: 2 via RESPIRATORY_TRACT
  Filled 2020-02-18: qty 6.7

## 2020-02-18 MED ORDER — METHOCARBAMOL 500 MG PO TABS: 500.0000 mg | ORAL_TABLET | Freq: Three times a day (TID) | ORAL | 0 refills | Status: DC | PRN

## 2020-02-18 MED ORDER — AEROCHAMBER PLUS FLO-VU LARGE MISC
1.0000 | Freq: Once | Status: DC
  Filled 2020-02-18 (×3): qty 1

## 2020-02-18 MED ORDER — SODIUM CHLORIDE 0.9 % IV BOLUS
500.0000 mL | Freq: Once | INTRAVENOUS | Status: AC
  Administered 2020-02-18: 500 mL via INTRAVENOUS

## 2020-02-18 NOTE — ED Provider Notes (Signed)
[facility] EMERGENCY DEPARTMENT Provider Note   CSN: [id number] Arrival date & time: 02/18/20  1655     History Chief Complaint  Patient presents with  . Covid +  . Cough  . Shortness of Breath    Pamela Moreno is a 44 y.o. female with a history of GERD who reports to have tested positive for COVID 19 on 02/09/20 who presents to the ED with complaints of worsening dyspnea since onset of sxs 02/18.  Patient reports symptoms of nasal congestion, dry cough, shortness of breath, chest pain with coughing otherwise soreness, chills, generalized body aches, and loose stools.  She states that she feels like her breathing trouble is getting progressively worse and that she is a bit lightheaded given she has not had much of an appetite or consume much p.o, lightheadedness is worse with position changes, no other alleviating or aggravating factors other than what is mentioned above.  Denies fever, nausea, vomiting, syncope, abdominal pain, melena, hematochezia, or dysuria. Denies leg pain/swelling, hemoptysis, recent surgery/trauma, recent long travel, hormone use, personal hx of cancer, or hx of DVT/PE.     HPI     Past Medical History:  Diagnosis Date  . GERD (gastroesophageal reflux disease)     There are no problems to display for this patient.   History reviewed. No pertinent surgical history.   OB History   No obstetric history on file.     Family History  Problem Relation Age of Onset  . Cancer Mother   . Hypertension Father   . Hyperlipidemia Father     Social History   Tobacco Use  . Smoking status: Never Smoker  . Smokeless tobacco: Never Used  Substance Use Topics  . Alcohol use: Never  . Drug use: Never    Home Medications Prior to Admission medications   Medication Sig Start Date End Date Taking? Authorizing Provider  benzonatate (TESSALON) 200 MG capsule Take 1 capsule (200 mg total) by mouth 2 (two) times daily as needed for cough. 02/17/20    Eustace Moore, MD  dexamethasone (DECADRON) 6 MG tablet Take 1 tablet (6 mg total) by mouth daily. 02/17/20   Eustace Moore, MD  pantoprazole (PROTONIX) 40 MG tablet Take 40 mg by mouth daily. 01/23/20   [provider]  esomeprazole (NEXIUM) 40 MG capsule Take 1 capsule (40 mg total) by mouth daily. 04/04/18 02/17/20  Belinda Fisher, PA-C  omeprazole (PRILOSEC) 10 MG capsule Take by mouth.  02/17/20  [provider]    Allergies    Penicillins and Sulfa antibiotics  Review of Systems   Review of Systems  Constitutional: Positive for fatigue. Negative for chills, diaphoresis and fever.  HENT: Positive for congestion. Negative for ear pain and sore throat.   Respiratory: Positive for cough and shortness of breath.   Cardiovascular: Positive for chest pain. Negative for leg swelling.  Gastrointestinal: Positive for diarrhea. Negative for abdominal pain, anal bleeding, blood in stool, constipation, nausea and vomiting.  Neurological: Positive for light-headedness. Negative for syncope, speech difficulty, weakness, numbness and headaches.  All other systems reviewed and are negative.   Physical Exam Updated Vital Signs BP 116/76 (BP Location: Left Arm)   Pulse 76   Temp 97.9 F (36.6 C) (Oral)   Resp 19   SpO2 97%   Physical Exam Vitals and nursing note reviewed.  Constitutional:      General: She is not in acute distress.    Appearance: She is  well-developed. She is not toxic-appearing.  HENT:     Head: Normocephalic and atraumatic.     Mouth/Throat:     Comments: Dry mucous membranes. Eyes:     General:        Right eye: No discharge.        Left eye: No discharge.     Extraocular Movements: Extraocular movements intact.     Conjunctiva/sclera: Conjunctivae normal.     Pupils: Pupils are equal, round, and reactive to light.  Cardiovascular:     Rate and Rhythm: Normal rate and regular rhythm.  Pulmonary:     Effort: Pulmonary effort is normal. No  respiratory distress.     Breath sounds: Normal breath sounds. No wheezing, rhonchi or rales.     Comments: Ambulatory SPO2 maintaining greater than 94%. Chest:     Chest wall: Tenderness (anterior chest wall which reproduces patient's chest wall tenderness) present.  Abdominal:     General: There is no distension.     Palpations: Abdomen is soft.     Tenderness: There is no abdominal tenderness.  Musculoskeletal:     Cervical back: Neck supple.  Skin:    General: Skin is warm and dry.     Findings: No rash.  Neurological:     General: No focal deficit present.     Mental Status: She is alert.     Comments: Clear speech.  CN II through XII grossly intact.  Sensation grossly intact bilateral upper and lower extremities.  5 out of 5 symmetric grip strength and strength with plantar dorsiflexion bilaterally.  Psychiatric:        Behavior: Behavior normal.    ED Results / Procedures / Treatments   Labs (all labs ordered are listed, but only abnormal results are displayed) Labs Reviewed  COMPREHENSIVE METABOLIC PANEL - Abnormal; Notable for the following components:      Result Value   CO2 20 (*)    Glucose, Bld 193 (*)    Albumin 3.2 (*)    AST 50 (*)    All other components within normal limits  CBC WITH DIFFERENTIAL/PLATELET    EKG EKG Interpretation  Date/Time:  Monday February 18 2020 17:58:54 EST Ventricular Rate:  70 PR Interval:    QRS Duration: 92 QT Interval:  421 QTC Calculation: 455 R Axis:   71 Text Interpretation: Sinus rhythm No significant change since last tracing Confirmed by Melene Plan [phone]) on 02/18/2020 6:21:09 PM   Radiology DG Chest 2 View  Result Date: 02/17/2020 CLINICAL DATA:  44 year old female with shortness of breath. Positive COVID-19 EXAM: CHEST - 2 VIEW COMPARISON:  Chest radiograph dated 02/16/2016. FINDINGS: Faint peripheral and subpleural hazy densities primarily in the left lower lung field most consistent with pneumonia, likely viral  and atypical in etiology and in keeping with COVID-19. Clinical correlation and follow-up recommended. No lobar consolidation, pleural effusion or pneumothorax. The cardiac silhouette is within normal limits. No acute osseous pathology. IMPRESSION: Findings most consistent with COVID-19. Clinical correlation and follow-up recommended. Electronically Signed   By: Elgie Collard M.D.   On: 02/17/2020 18:35   DG Chest Portable 1 View  Result Date: 02/18/2020 CLINICAL DATA:  COVID positive test on February 09, 2020. Cough and chest pressure. EXAM: PORTABLE CHEST 1 VIEW COMPARISON:  February 17, 2020 FINDINGS: Stable apparent mild cardiomegaly with central pulmonary vascular congestion that could be secondary to the low lung volumes. Mild interstitial edema, similar to the prior study. No obvious pleural effusion or consolidation.  No pneumothorax. Minimal degenerative vertebral body endplate changes. IMPRESSION: Similar mild interstitial edema consistent with viral infection. No pneumonia or obvious pleural effusion. Shallow inspiration. Electronically Signed   By: Revonda Humphrey   On: 02/18/2020 17:58    Procedures Procedures (including critical care time)  Medications Ordered in ED Medications  AeroChamber Plus Flo-Vu Large MISC 1 each (has no administration in time range)  sodium chloride 0.9 % bolus 500 mL (500 mLs Intravenous New Bag/Given 02/18/20 1927)  albuterol (VENTOLIN HFA) 108 (90 Base) MCG/ACT inhaler 2 puff (2 puffs Inhalation Given 02/18/20 1903)    ED Course  I have reviewed the triage vital signs and the nursing notes.  Pertinent labs & imaging results that were available during my care of the patient were reviewed by me and considered in my medical decision making (see chart for details).    MDM Rules/Calculators/A&P                      Patient diagnosed with COVID 19 02/20 presents to the ED with worsening dyspnea and generally not feeling well. Patient is nontoxic, resting  comfortably, vitals WNL. Dry mucous membranes, lungs clear, chest pain reproducible with chest wall palpation & coughing. CXR consistent w/ similar appearance to prior, consistent w/ viral infection, I personally interpreted imaging and agree with radiologist interpretation. EKG with NSR, no ischemic changes, low risk wells, chest pain reproducible with chest wall palpation, doubt ACS or PE.  Labs reviewed, somewhat elevated blood sugar, will need PCP recheck.  No significant electrolyte derangement.  Mildly elevated AST, likely secondary to Covid, no anemia/leukocytosis/leukopenia.  Work-up overall reassuring in the emergency department.  Patient feeling mildly improved status post small fluid bolus and albuterol inhaler.  I personally ambulated patient on initial assessment as well as with reevaluation and her SPO2 is consistently greater than 94% on room air.  She is not in respiratory distress.  She appears overall appropriate for discharge home.  Will provide Robaxin to help with chest discomfort with coughing as well as the inhaler she was given here.  Continue her previously prescribed steroids. I discussed results, treatment plan, need for follow-up, and return precautions with the patient. Provided opportunity for questions, patient confirmed understanding and is in agreement with plan.    Final Clinical Impression(s) / ED Diagnoses Final diagnoses:  [id number]    Rx / DC Orders ED Discharge Orders         Ordered    methocarbamol (ROBAXIN) 500 MG tablet  Every 8 hours PRN     02/18/20 2112           Talasia, Saulter, PA-C 02/18/20 2115    Deno Etienne, DO 02/18/20 2352

## 2020-02-18 NOTE — ED Triage Notes (Signed)
Pt with + Covid test on 02/09/2020 with cough, chest discomfort.

## 2020-02-18 NOTE — Discharge Instructions (Addendum)
You were seen in the ER for trouble breathing.   You are seen in the emergency department for trouble breathing.  Your labs were overall fairly reassuring, your one liver function test was very mildly elevated which frequently occurs with COVID-19, please have this rechecked by your primary care provider within 1 to 2 weeks.  Your chest x-ray appears consistent with a viral illness such as COVID-19.  Your oxygen saturations were overall reassuring  We are instructing patient's with COVID 19 or symptoms of COVID 19 to quarantine themselves for 14 days. You may be able to discontinue self quarantine if the following conditions are met:   Persons with COVID-19 who have symptoms and were directed to care for themselves at home may discontinue home isolation under the  following conditions: - It has been at least 7 days have passed since symptoms first appeared. - AND at least 3 days (72 hours) have passed since recovery defined as resolution of fever without the use of fever-reducing medications and improvement in respiratory symptoms (e.g., cough, shortness of breath)  Please follow the below quarantine instructions.   Please continue to take the steroid prescribed at your prior visit.  We are sending you home with the following medicines to help with your symptoms: - Albuterol inhaler-this is a medication to use 1 to 2 puffs every 4-6 hours as needed for trouble breathing or wheezing. - Robaxin is the muscle relaxer I have prescribed, this is meant to help with muscle tightness which may be contributing to your chest discomfort.Marland Kitchen Be aware that this medication may make you drowsy therefore the first time you take this it should be at a time you are in an environment where you can rest. Do not drive or operate heavy machinery when taking this medication. Do not drink alcohol or take other sedating medications with this medicine such as narcotics or benzodiazepines.   We have prescribed you new  medication(s) today. Discuss the medications prescribed today with your pharmacist as they can have adverse effects and interactions with your other medicines including over the counter and prescribed medications. Seek medical evaluation if you start to experience new or abnormal symptoms after taking one of these medicines, seek care immediately if you start to experience difficulty breathing, feeling of your throat closing, facial swelling, or rash as these could be indications of a more serious allergic reaction  Please be sure to drink plenty of fluids and stay well-hydrated.  Please follow up with primary care within 3-5 days for re-evaluation- call prior to going to the office to make them aware of your symptoms as some offices are altering their method of seeing patients with COVID 19 symptoms. Return to the ER for new or worsening symptoms including but not limited to increased work of breathing,  coughing up blood, chest pain, passing out, or any other concerns.       Person Under Monitoring Name: Pamela Moreno  Location: [place] [address]   Infection Prevention Recommendations for Individuals Confirmed to have, or Being Evaluated for, 2019 Novel Coronavirus (COVID-19) Infection Who Receive Care at Home  Individuals who are confirmed to have, or are being evaluated for, COVID-19 should follow the prevention steps below until a healthcare provider or local or state health department says they can return to normal activities.  Stay home except to get medical care You should restrict activities outside your home, except for getting medical care. Do not go to work, school, or public  areas, and do not use public transportation or taxis.  Call ahead before visiting your doctor Before your medical appointment, call the healthcare provider and tell them that you have, or are being evaluated for, COVID-19 infection. This will help the healthcare provider's office take  steps to keep other people from getting infected. Ask your healthcare provider to call the local or state health department.  Monitor your symptoms Seek prompt medical attention if your illness is worsening (e.g., difficulty breathing). Before going to your medical appointment, call the healthcare provider and tell them that you have, or are being evaluated for, COVID-19 infection. Ask your healthcare provider to call the local or state health department.  Wear a facemask You should wear a facemask that covers your nose and mouth when you are in the same room with other people and when you visit a healthcare provider. People who live with or visit you should also wear a facemask while they are in the same room with you.  Separate yourself from other people in your home As much as possible, you should stay in a different room from other people in your home. Also, you should use a separate bathroom, if available.  Avoid sharing household items You should not share dishes, drinking glasses, cups, eating utensils, towels, bedding, or other items with other people in your home. After using these items, you should wash them thoroughly with soap and water.  Cover your coughs and sneezes Cover your mouth and nose with a tissue when you cough or sneeze, or you can cough or sneeze into your sleeve. Throw used tissues in a lined trash can, and immediately wash your hands with soap and water for at least 20 seconds or use an alcohol-based hand rub.  Wash your [facility] [employer] your hands often and thoroughly with soap and water for at least 20 seconds. You can use an alcohol-based hand sanitizer if soap and water are not available and if your hands are not visibly dirty. Avoid touching your eyes, nose, and mouth with unwashed hands.   Prevention Steps for Caregivers and Household Members of Individuals Confirmed to have, or Being Evaluated for, COVID-19 Infection Being Cared for in the Home  If you  live with, or provide care at home for, a person confirmed to have, or being evaluated for, COVID-19 infection please follow these guidelines to prevent infection:  Follow healthcare provider's instructions Make sure that you understand and can help the patient follow any healthcare provider instructions for all care.  Provide for the patient's basic needs You should help the patient with basic needs in the home and provide support for getting groceries, prescriptions, and other personal needs.  Monitor the patient's symptoms If they are getting sicker, call his or her medical provider and tell them that the patient has, or is being evaluated for, COVID-19 infection. This will help the healthcare provider's office take steps to keep other people from getting infected. Ask the healthcare provider to call the local or state health department.  Limit the number of people who have contact with the patient If possible, have only one caregiver for the patient. Other household members should stay in another home or place of residence. If this is not possible, they should stay in another room, or be separated from the patient as much as possible. Use a separate bathroom, if available. Restrict visitors who do not have an essential need to be in the home.  Keep older adults, very young children, and other  sick people away from the patient Keep older adults, very young children, and those who have compromised immune systems or chronic health conditions away from the patient. This includes people with chronic heart, lung, or kidney conditions, diabetes, and cancer.  Ensure good ventilation Make sure that shared spaces in the home have good air flow, such as from an air conditioner or an opened window, weather permitting.  Wash your hands often Wash your hands often and thoroughly with soap and water for at least 20 seconds. You can use an alcohol based hand sanitizer if soap and water are not  available and if your hands are not visibly dirty. Avoid touching your eyes, nose, and mouth with unwashed hands. Use disposable paper towels to dry your hands. If not available, use dedicated cloth towels and replace them when they become wet.  Wear a facemask and gloves Wear a disposable facemask at all times in the room and gloves when you touch or have contact with the patient's blood, body fluids, and/or secretions or excretions, such as sweat, saliva, sputum, nasal mucus, vomit, urine, or feces.  Ensure the mask fits over your nose and mouth tightly, and do not touch it during use. Throw out disposable facemasks and gloves after using them. Do not reuse. Wash your hands immediately after removing your facemask and gloves. If your personal clothing becomes contaminated, carefully remove clothing and launder. Wash your hands after handling contaminated clothing. Place all used disposable facemasks, gloves, and other waste in a lined container before disposing them with other household waste. Remove gloves and wash your hands immediately after handling these items.  Do not share dishes, glasses, or other household items with the patient Avoid sharing household items. You should not share dishes, drinking glasses, cups, eating utensils, towels, bedding, or other items with a patient who is confirmed to have, or being evaluated for, COVID-19 infection. After the person uses these items, you should wash them thoroughly with soap and water.  Wash laundry thoroughly Immediately remove and wash clothes or bedding that have blood, body fluids, and/or secretions or excretions, such as sweat, saliva, sputum, nasal mucus, vomit, urine, or feces, on them. Wear gloves when handling laundry from the patient. Read and follow directions on labels of laundry or clothing items and detergent. In general, wash and dry with the warmest temperatures recommended on the label.  Clean all areas the individual has  used often Clean all touchable surfaces, such as counters, tabletops, doorknobs, bathroom fixtures, toilets, phones, keyboards, tablets, and bedside tables, every day. Also, clean any surfaces that may have blood, body fluids, and/or secretions or excretions on them. Wear gloves when cleaning surfaces the patient has come in contact with. Use a diluted bleach solution (e.g., dilute bleach with 1 part bleach and 10 parts water) or a household disinfectant with a label that says EPA-registered for coronaviruses. To make a bleach solution at home, add 1 tablespoon of bleach to 1 quart (4 cups) of water. For a larger supply, add  cup of bleach to 1 gallon (16 cups) of water. Read labels of cleaning products and follow recommendations provided on product labels. Labels contain instructions for safe and effective use of the cleaning product including precautions you should take when applying the product, such as wearing gloves or eye protection and making sure you have good ventilation during use of the product. Remove gloves and wash hands immediately after cleaning.  Monitor yourself for signs and symptoms of illness Caregivers and household members  are considered close contacts, should monitor their health, and will be asked to limit movement outside of the home to the extent possible. Follow the monitoring steps for close contacts listed on the symptom monitoring form.   ? If you have additional questions, contact your local health department or call the epidemiologist on call at [phone] (available 24/7). ? This guidance is subject to change. For the most up-to-date guidance from [facility], please refer to their website: YouBlogs.pl

## 2020-02-26 DIAGNOSIS — U071 COVID-19: Secondary | ICD-10-CM | POA: Diagnosis not present

## 2020-02-26 DIAGNOSIS — F419 Anxiety disorder, unspecified: Secondary | ICD-10-CM | POA: Diagnosis not present

## 2020-02-26 DIAGNOSIS — R350 Frequency of micturition: Secondary | ICD-10-CM | POA: Diagnosis not present

## 2020-02-26 DIAGNOSIS — R202 Paresthesia of skin: Secondary | ICD-10-CM | POA: Diagnosis not present

## 2020-03-04 ENCOUNTER — Ambulatory Visit
Admission: RE | Admit: 2020-03-04 | Discharge: 2020-03-04 | Disposition: A | Discharge status: Home / Self Care | Payer: BC Managed Care – PPO | Source: Ambulatory Visit | Attending: Family Medicine | Admitting: Family Medicine

## 2020-03-04 ENCOUNTER — Other Ambulatory Visit: Payer: Self-pay | Admitting: Family Medicine

## 2020-03-04 DIAGNOSIS — U071 COVID-19: Secondary | ICD-10-CM

## 2020-03-04 DIAGNOSIS — R5383 Other fatigue: Secondary | ICD-10-CM | POA: Diagnosis not present

## 2020-03-10 DIAGNOSIS — R0789 Other chest pain: Secondary | ICD-10-CM | POA: Diagnosis not present

## 2020-03-10 DIAGNOSIS — R350 Frequency of micturition: Secondary | ICD-10-CM | POA: Diagnosis not present

## 2020-03-10 DIAGNOSIS — R5383 Other fatigue: Secondary | ICD-10-CM | POA: Diagnosis not present

## 2020-03-10 DIAGNOSIS — K219 Gastro-esophageal reflux disease without esophagitis: Secondary | ICD-10-CM | POA: Diagnosis not present

## 2020-03-10 DIAGNOSIS — Z8619 Personal history of other infectious and parasitic diseases: Secondary | ICD-10-CM | POA: Diagnosis not present

## 2020-03-10 DIAGNOSIS — E559 Vitamin D deficiency, unspecified: Secondary | ICD-10-CM | POA: Diagnosis not present

## 2020-03-10 DIAGNOSIS — Z79899 Other long term (current) drug therapy: Secondary | ICD-10-CM | POA: Diagnosis not present

## 2020-03-24 ENCOUNTER — Emergency Department (HOSPITAL_COMMUNITY)
Admission: EM | Admit: 2020-03-24 | Discharge: 2020-03-24 | Disposition: A | Discharge status: Home / Self Care | Payer: BC Managed Care – PPO | Attending: Emergency Medicine | Admitting: Emergency Medicine

## 2020-03-24 ENCOUNTER — Emergency Department (HOSPITAL_BASED_OUTPATIENT_CLINIC_OR_DEPARTMENT_OTHER)
Admit: 2020-03-24 | Discharge: 2020-03-24 | Disposition: A | Discharge status: Home / Self Care | Payer: BC Managed Care – PPO | Attending: Emergency Medicine | Admitting: Emergency Medicine

## 2020-03-24 ENCOUNTER — Encounter (HOSPITAL_COMMUNITY): Payer: Self-pay | Admitting: Emergency Medicine

## 2020-03-24 ENCOUNTER — Other Ambulatory Visit: Payer: Self-pay

## 2020-03-24 ENCOUNTER — Emergency Department (HOSPITAL_COMMUNITY): Payer: BC Managed Care – PPO

## 2020-03-24 DIAGNOSIS — R0789 Other chest pain: Secondary | ICD-10-CM | POA: Insufficient documentation

## 2020-03-24 DIAGNOSIS — M62838 Other muscle spasm: Secondary | ICD-10-CM | POA: Diagnosis not present

## 2020-03-24 DIAGNOSIS — M79609 Pain in unspecified limb: Secondary | ICD-10-CM

## 2020-03-24 DIAGNOSIS — Z79899 Other long term (current) drug therapy: Secondary | ICD-10-CM | POA: Insufficient documentation

## 2020-03-24 DIAGNOSIS — R079 Chest pain, unspecified: Secondary | ICD-10-CM

## 2020-03-24 DIAGNOSIS — R0602 Shortness of breath: Secondary | ICD-10-CM | POA: Insufficient documentation

## 2020-03-24 LAB — CBC
HCT: 39.9 % (ref 36.0–46.0)
Hemoglobin: 12.9 g/dL (ref 12.0–15.0)
MCH: 27.8 pg (ref 26.0–34.0)
MCHC: 32.3 g/dL (ref 30.0–36.0)
MCV: 86 fL (ref 80.0–100.0)
Platelets: 255 10*3/uL (ref 150–400)
RBC: 4.64 MIL/uL (ref 3.87–5.11)
RDW: 13.8 % (ref 11.5–15.5)
WBC: 8.8 10*3/uL (ref 4.0–10.5)
nRBC: 0 % (ref 0.0–0.2)

## 2020-03-24 LAB — BASIC METABOLIC PANEL
Anion gap: 11 (ref 5–15)
BUN: 13 mg/dL (ref 6–20)
CO2: 20 mmol/L — ABNORMAL LOW (ref 22–32)
Calcium: 9 mg/dL (ref 8.9–10.3)
Chloride: 108 mmol/L (ref 98–111)
Creatinine, Ser: 0.5 mg/dL (ref 0.44–1.00)
GFR calc Af Amer: >60 mL/min (ref >60)
GFR calc non Af Amer: >60 mL/min (ref >60)
Glucose, Bld: 118 mg/dL — ABNORMAL HIGH (ref 70–99)
Potassium: 3.5 mmol/L (ref 3.5–5.1)
Sodium: 139 mmol/L (ref 135–145)

## 2020-03-24 LAB — I-STAT BETA HCG BLOOD, ED (MC, WL, AP ONLY): I-stat hCG, quantitative: <5 m[IU]/mL (ref <5)

## 2020-03-24 LAB — TROPONIN I (HIGH SENSITIVITY)
Troponin I (High Sensitivity): 5 ng/L (ref <18)
Troponin I (High Sensitivity): 2 ng/L (ref <18)

## 2020-03-24 LAB — D-DIMER, QUANTITATIVE: D-Dimer, Quant: <0.27 ug/mL-FEU (ref 0.00–0.50)

## 2020-03-24 MED ORDER — DIPHENHYDRAMINE HCL 50 MG/ML IJ SOLN
25.0000 mg | Freq: Once | INTRAMUSCULAR | Status: AC
  Administered 2020-03-24: 25 mg via INTRAVENOUS
  Filled 2020-03-24: qty 1

## 2020-03-24 MED ORDER — PROCHLORPERAZINE EDISYLATE 10 MG/2ML IJ SOLN
10.0000 mg | Freq: Once | INTRAMUSCULAR | Status: AC
  Administered 2020-03-24: 10 mg via INTRAVENOUS
  Filled 2020-03-24: qty 2

## 2020-03-24 MED ORDER — SODIUM CHLORIDE 0.9 % IV BOLUS
1000.0000 mL | Freq: Once | INTRAVENOUS | Status: AC
  Administered 2020-03-24: 1000 mL via INTRAVENOUS

## 2020-03-24 MED ORDER — CYCLOBENZAPRINE HCL 10 MG PO TABS: 10.0000 mg | ORAL_TABLET | Freq: Two times a day (BID) | ORAL | 0 refills | Status: DC | PRN

## 2020-03-24 MED ORDER — SODIUM CHLORIDE 0.9% FLUSH: 3.0000 mL | Freq: Once | INTRAVENOUS | Status: DC

## 2020-03-24 NOTE — Discharge Instructions (Signed)
Your work-up today was overall reassuring.  The blood tests were negative for blood clot and your cardiac enzymes were negative both times we checked them.  Your chest x-ray was reassuring and the ultrasound showed no blood clots as well.  As your headache and symptoms improved with the medications, we feel you are safe for discharge home.  Please use the muscle relaxant medication to help with the likely chest wall discomfort and muscle spasms we felt.  Please rest and stay hydrated.  Please follow-up with your primary doctor.  If any symptoms change or worsen, please return to the nearest emergency department.

## 2020-03-24 NOTE — ED Triage Notes (Signed)
Pt. Stated,I have chest pain started yesterday on and off and today I had episodes .

## 2020-03-24 NOTE — Progress Notes (Signed)
Right lower ext venous  has been completed. Refer to [facility] under chart review to view preliminary results.   03/24/2020  2:17 PM Rosalba Totty, Gerarda Gunther  ;

## 2020-03-24 NOTE — ED Provider Notes (Signed)
[facility] EMERGENCY DEPARTMENT Provider Note   CSN: [id number] Arrival date & time: 03/24/20  1042     History Chief Complaint  Patient presents with  . Chest Pain    Pamela Moreno is a 44 y.o. female.  The history is provided by the patient and medical records. No language interpreter was used.  Chest Pain Pain location:  L chest and L lateral chest Pain quality: aching and sharp   Pain radiates to:  Neck and L shoulder Pain severity:  Moderate Onset quality:  Gradual Duration:  2 days Timing:  Constant Progression:  Waxing and waning Chronicity:  New Context: movement and raising an arm   Relieved by:  Nothing Worsened by:  Nothing Ineffective treatments:  None tried Associated symptoms: shortness of breath   Associated symptoms: no abdominal pain, no altered mental status, no back pain, no claudication, no cough, no diaphoresis, no dizziness, no dysphagia, no fatigue, no fever, no headache, no lower extremity edema, no nausea, no numbness, no palpitations, no syncope, no vomiting and no weakness   Risk factors: no aortic disease, no birth control, no coronary artery disease, no diabetes mellitus, no high cholesterol, no hypertension, no immobilization, not female, no prior DVT/PE and no surgery        Past Medical History:  Diagnosis Date  . GERD (gastroesophageal reflux disease)     There are no problems to display for this patient.   History reviewed. No pertinent surgical history.   OB History   No obstetric history on file.     Family History  Problem Relation Age of Onset  . Cancer Mother   . Hypertension Father   . Hyperlipidemia Father     Social History   Tobacco Use  . Smoking status: Never Smoker  . Smokeless tobacco: Never Used  Substance Use Topics  . Alcohol use: Never  . Drug use: Never    Home Medications Prior to Admission medications   Medication Sig Start Date End Date Taking? Authorizing Provider  benzonatate  (TESSALON) 200 MG capsule Take 1 capsule (200 mg total) by mouth 2 (two) times daily as needed for cough. 02/17/20   Eustace Moore, MD  dexamethasone (DECADRON) 6 MG tablet Take 1 tablet (6 mg total) by mouth daily. 02/17/20   Eustace Moore, MD  methocarbamol (ROBAXIN) 500 MG tablet Take 1 tablet (500 mg total) by mouth every 8 (eight) hours as needed for muscle spasms. 02/18/20   Petrucelli, Samantha R, PA-C  pantoprazole (PROTONIX) 40 MG tablet Take 40 mg by mouth daily. 01/23/20   [provider]  esomeprazole (NEXIUM) 40 MG capsule Take 1 capsule (40 mg total) by mouth daily. 04/04/18 02/17/20  Belinda Fisher, PA-C  omeprazole (PRILOSEC) 10 MG capsule Take by mouth.  02/17/20  [provider]    Allergies    Penicillins and Sulfa antibiotics  Review of Systems   Review of Systems  Constitutional: Negative for chills, diaphoresis, fatigue and fever.  HENT: Negative for congestion and trouble swallowing.   Eyes: Negative for visual disturbance.  Respiratory: Positive for shortness of breath. Negative for cough, chest tightness, wheezing and stridor.   Cardiovascular: Positive for chest pain. Negative for palpitations, claudication, leg swelling and syncope.  Gastrointestinal: Negative for abdominal pain, constipation, diarrhea, nausea and vomiting.  Genitourinary: Negative for dysuria, flank pain and frequency.  Musculoskeletal: Negative for back pain, neck pain and neck stiffness.  Skin: Negative for rash and wound.  Neurological:  Negative for dizziness, weakness, numbness and headaches.  Psychiatric/Behavioral: Negative for agitation and confusion.  All other systems reviewed and are negative.   Physical Exam Updated Vital Signs BP 134/75 (BP Location: Left Arm)   Pulse 74   Temp 98.5 F (36.9 C) (Oral)   Resp 20   Ht 5\' 2"  (1.575 m)   Wt 81.6 kg   LMP 03/19/2020   SpO2 99%   BMI 32.92 kg/m   Physical Exam Vitals and nursing note reviewed.  Constitutional:       General: She is not in acute distress.    Appearance: She is well-developed. She is not ill-appearing, toxic-appearing or diaphoretic.  HENT:     Head: Normocephalic and atraumatic.     Right Ear: External ear normal.     Left Ear: External ear normal.     Nose: Nose normal.     Mouth/Throat:     Pharynx: No oropharyngeal exudate.  Eyes:     Conjunctiva/sclera: Conjunctivae normal.     Pupils: Pupils are equal, round, and reactive to light.  Cardiovascular:     Rate and Rhythm: Normal rate.     Heart sounds: Normal heart sounds. No murmur.  Pulmonary:     Effort: Pulmonary effort is normal. Tachypnea present. No respiratory distress.     Breath sounds: No stridor. No decreased breath sounds, wheezing, rhonchi or rales.  Chest:     Chest wall: Tenderness present.  Abdominal:     General: There is no distension.     Palpations: Abdomen is soft.     Tenderness: There is no abdominal tenderness. There is no rebound.  Musculoskeletal:     Cervical back: Normal range of motion and neck supple.     Right lower leg: Tenderness present. No edema.     Left lower leg: No tenderness. No edema.  Skin:    General: Skin is warm.     Capillary Refill: Capillary refill takes less than 2 seconds.     Coloration: Skin is not pale.     Findings: No erythema or rash.  Neurological:     General: No focal deficit present.     Mental Status: She is alert and oriented to person, place, and time.     Motor: No abnormal muscle tone.     Coordination: Coordination normal.     Deep Tendon Reflexes: Reflexes are normal and symmetric.  Psychiatric:        Mood and Affect: Mood normal. Mood is not anxious.     ED Results / Procedures / Treatments   Labs (all labs ordered are listed, but only abnormal results are displayed) Labs Reviewed  BASIC METABOLIC PANEL - Abnormal; Notable for the following components:      Result Value   CO2 20 (*)    Glucose, Bld 118 (*)    All other components  within normal limits  CBC  D-DIMER, QUANTITATIVE (NOT AT [facility])  I-STAT BETA HCG BLOOD, ED ([facility], [facility], [facility] ONLY)  TROPONIN I (HIGH SENSITIVITY)  TROPONIN I (HIGH SENSITIVITY)    EKG EKG Interpretation  Date/Time:  Monday March 24 2020 10:44:41 EDT Ventricular Rate:  76 PR Interval:  148 QRS Duration: 78 QT Interval:  400 QTC Calculation: 450 R Axis:   73 Text Interpretation: Normal sinus rhythm Normal ECG When compared to prior, no significant changes seen. No STEMI Confirmed by Antony Blackbird [phone]) on 03/24/2020 12:23:07 PM   Radiology DG Chest 2 View  Result Date: 03/24/2020  CLINICAL DATA:  Chest pain.  Mid chest pain that started yesterday EXAM: CHEST - 2 VIEW COMPARISON:  03/04/2020 FINDINGS: Cardiomediastinal contours and hilar structures are normal. Lungs are clear.  No pleural effusion. Visualized skeletal structures are normal. IMPRESSION: Normal chest. Electronically Signed   By: Donzetta Kohut M.D.   On: 03/24/2020 11:22   VAS [place] LOWER EXTREMITY VENOUS (DVT) (ONLY [facility] & [facility])  Result Date: 03/24/2020  Lower Venous DVTStudy Other Indications: Patient presents with pain in her right calf for two months. Comparison Study: No priors. Performing Technologist: Marilynne Halsted RDMS, RVT  Examination Guidelines: A complete evaluation includes B-mode imaging, spectral Doppler, color Doppler, and power Doppler as needed of all accessible portions of each vessel. Bilateral testing is considered an integral part of a complete examination. Limited examinations for reoccurring indications may be performed as noted. The reflux portion of the exam is performed with the patient in reverse Trendelenburg.  +---------+---------------+---------+-----------+----------+--------------+ RIGHT    CompressibilityPhasicitySpontaneityPropertiesThrombus Aging +---------+---------------+---------+-----------+----------+--------------+ CFV      Full           Yes      Yes                                  +---------+---------------+---------+-----------+----------+--------------+ SFJ      Full                                                        +---------+---------------+---------+-----------+----------+--------------+ FV Prox  Full                                                        +---------+---------------+---------+-----------+----------+--------------+ FV Mid   Full                                                        +---------+---------------+---------+-----------+----------+--------------+ FV DistalFull                                                        +---------+---------------+---------+-----------+----------+--------------+ PFV      Full                                                        +---------+---------------+---------+-----------+----------+--------------+ POP      Full           Yes      Yes                                 +---------+---------------+---------+-----------+----------+--------------+ PTV      Full                                                        +---------+---------------+---------+-----------+----------+--------------+  PERO     Full                                                        +---------+---------------+---------+-----------+----------+--------------+   +----+---------------+---------+-----------+----------+--------------+ LEFTCompressibilityPhasicitySpontaneityPropertiesThrombus Aging +----+---------------+---------+-----------+----------+--------------+ CFV Full           Yes      Yes                                 +----+---------------+---------+-----------+----------+--------------+ SFJ Full                                                        +----+---------------+---------+-----------+----------+--------------+     Summary: RIGHT: - There is no evidence of deep vein thrombosis in the lower extremity.  - No cystic structure found in the popliteal fossa.  LEFT: - No  evidence of common femoral vein obstruction.  *See table(s) above for measurements and observations.    Preliminary     Procedures Procedures (including critical care time)  Medications Ordered in ED Medications  sodium chloride flush (NS) 0.9 % injection 3 mL (3 mLs Intravenous Not Given 03/24/20 1345)  sodium chloride 0.9 % bolus 1,000 mL (0 mLs Intravenous Stopped 03/24/20 1542)  prochlorperazine (COMPAZINE) injection 10 mg (10 mg Intravenous Given 03/24/20 1338)  diphenhydrAMINE (BENADRYL) injection 25 mg (25 mg Intravenous Given 03/24/20 1338)    ED Course  I have reviewed the triage vital signs and the nursing notes.  Pertinent labs & imaging results that were available during my care of the patient were reviewed by me and considered in my medical decision making (see chart for details).    MDM Rules/Calculators/A&P                      Pamela Moreno is a 44 y.o. female with a past medical history significant for presents with left-sided chest pain since yesterday.  Patient reports that she has been having left anterior and lateral chest discomfort that radiates occasionally up into her neck and into her arm.  She reports that it is worsened when she moves or twists her body.  Worse when she moves her arm.  She reports occasional shortness of breath with it but not constant.  No history of DVT or PE.  No fevers, chills, congestion, or cough.  No Covid exposures or Covid symptoms otherwise.  No nausea, vomiting, diaphoresis.  No lightheadedness or syncope.  No leg swelling but he does report some right calf discomfort.  No long travel recently.  No abdominal pain.    On exam, patient has tenderness in her left chest.  Also some tenderness in the left lateral upper neck/chest area.  Normal grip strength and sensation bilaterally.  Good pulses in upper extremities.  No significant back tenderness.  Lungs clear.  Abdomen nontender.  Good pulses in lower extremities.  Mild tenderness in right calf but  no edema or swelling seen.  EKG shows no STEMI.  Vital signs reassuring on arrival with mild tachypnea on evaluation.  Patient had work-up to rule out a concerning etiology of  her chest discomfort.  D-dimer was negative, doubt PE.  Troponin negative x2.  X-ray reassuring.  Other labs reassuring.  Suspect a musculoskeletal discomfort as there was also some muscle spasm palpated on exam.  Patient reported she had some headache and was given a headache cocktail and reassessment.  She reports that this completely resolved and her discomfort in her chest and shoulder also resolved.  I suspect some tension and musculoskeletal pain contributing.  Given her resolution of symptoms and reassuring work-up, we feel she is safe for discharge with PCP follow-up.  She was given a prescription for muscle relaxant given the muscle spasms and likely muscle tension.  Patient agrees with plan of care as well as follow-up and return precautions.  She had no other questions or concerns and was discharged in good condition.   Final Clinical Impression(s) / ED Diagnoses Final diagnoses:  Chest pain, unspecified type  Muscle spasm    Rx / DC Orders ED Discharge Orders         Ordered    cyclobenzaprine (FLEXERIL) 10 MG tablet  2 times daily PRN     03/24/20 1531         Clinical Impression: 1. Chest pain, unspecified type   2. Muscle spasm     Disposition: Discharge  Condition: Good  I have discussed the results, Dx and Tx plan with the pt(& family if present). He/she/they expressed understanding and agree(s) with the plan. Discharge instructions discussed at great length. Strict return precautions discussed and pt &/or family have verbalized understanding of the instructions. No further questions at time of discharge.    Discharge Medication List as of 03/24/2020  3:32 PM    START taking these medications   Details  cyclobenzaprine (FLEXERIL) 10 MG tablet Take 1 tablet (10 mg total) by mouth 2 (two)  times daily as needed for muscle spasms., Starting Mon 03/24/2020, Print        Follow Up: Laurann Montana, MD [address] [place] [address] [phone]     [facility] EMERGENCY DEPARTMENT [address] [id number] mc [place] [address] [phone]       Marquelle Musgrave, Canary Brim, MD 03/24/20 1700

## 2020-04-24 DIAGNOSIS — R202 Paresthesia of skin: Secondary | ICD-10-CM | POA: Diagnosis not present

## 2020-04-24 DIAGNOSIS — R079 Chest pain, unspecified: Secondary | ICD-10-CM | POA: Diagnosis not present

## 2020-04-24 DIAGNOSIS — R519 Headache, unspecified: Secondary | ICD-10-CM | POA: Diagnosis not present

## 2020-04-25 ENCOUNTER — Other Ambulatory Visit: Payer: Self-pay | Admitting: Family Medicine

## 2020-04-25 DIAGNOSIS — R519 Headache, unspecified: Secondary | ICD-10-CM

## 2020-05-10 DIAGNOSIS — R079 Chest pain, unspecified: Secondary | ICD-10-CM | POA: Insufficient documentation

## 2020-05-10 DIAGNOSIS — Z7189 Other specified counseling: Secondary | ICD-10-CM | POA: Insufficient documentation

## 2020-05-10 NOTE — Progress Notes (Signed)
Cardiology Office Note   Date:  05/12/2020   ID:  Pamela Moreno, DOB 1976-09-05, MRN [id number]  PCP:  Harlan Stains, MD  Cardiologist:   No primary care provider on file. Referring:  Harlan Stains, MD  No chief complaint on file.     History of Present Illness: Pamela Moreno is a 44 y.o. female who presents for evaluation of chest pain.  She is referred by Harlan Stains, MD.  She has no prior cardiac history.  She was well until January when she had Covid.  She had pneumonia.  She was fairly sick with this but not hospitalized.  Since that time she has had pain.  This is been in the back of her head.  But she is also had burning chest discomfort with some radiation down the left arm.  This is sporadic.  It mostly seems to wake her at night and is more the headache.  She cannot bring it on.  She does not describe associated nausea vomiting or diaphoresis.  It comes and goes spontaneously.  It happens about 6-7 times per day.  She is not having any new shortness of breath.  She is not having any weight gain or edema.  She is not describing palpitations, presyncope or syncope.  She never had this before.  I do note that she was in the emergency room in April.  I reviewed these records.  She had some chest pain but her troponin was negative.  There were no acute EKG changes.  Her troponins were negative x2.  She had a Doppler negative for DVT because she had some leg pain.   Past Medical History:  Diagnosis Date  . Dyslipidemia   . GERD (gastroesophageal reflux disease)     History reviewed. No pertinent surgical history.   Current Outpatient Medications  Medication Sig Dispense Refill  . LORazepam (ATIVAN) 0.5 MG tablet Take 0.25-0.5 mg by mouth 3 (three) times daily as needed.    . pantoprazole (PROTONIX) 40 MG tablet Take 40 mg by mouth daily.    . benzonatate (TESSALON) 200 MG capsule Take 1 capsule (200 mg total) by mouth 2 (two) times daily as needed for cough. (Patient not taking:  Reported on 05/12/2020) 20 capsule 0  . cyclobenzaprine (FLEXERIL) 10 MG tablet Take 1 tablet (10 mg total) by mouth 2 (two) times daily as needed for muscle spasms. (Patient not taking: Reported on 05/12/2020) 20 tablet 0  . dexamethasone (DECADRON) 6 MG tablet Take 1 tablet (6 mg total) by mouth daily. (Patient not taking: Reported on 05/12/2020) 10 tablet 0  . methocarbamol (ROBAXIN) 500 MG tablet Take 1 tablet (500 mg total) by mouth every 8 (eight) hours as needed for muscle spasms. (Patient not taking: Reported on 05/12/2020) 15 tablet 0   No current facility-administered medications for this visit.    Allergies:   Penicillins and Sulfa antibiotics    Social History:  The patient  reports that she has never smoked. She has never used smokeless tobacco. She reports that she does not drink alcohol or use drugs.   Family History:  The patient's family history includes Cancer in her mother; Hyperlipidemia in her father; Hypertension in her father.    ROS:  Please see the history of present illness.   Otherwise, review of systems are positive for none.   All other systems are reviewed and negative.    PHYSICAL EXAM: VS:  BP 113/72   Pulse 67   Ht  _0  (1.575 m)   Wt 181 lb 12.8 oz (82.5 kg)   SpO2 98%   BMI 33.25 kg/m  , BMI Body mass index is 33.25 kg/m. GENERAL:  Well appearing HEENT:  Pupils equal round and reactive, fundi not visualized, oral mucosa unremarkable NECK:  No jugular venous distention, waveform within normal limits, carotid upstroke brisk and symmetric, no bruits, no thyromegaly LYMPHATICS:  No cervical, inguinal adenopathy LUNGS:  Clear to auscultation bilaterally BACK:  No CVA tenderness CHEST:  Unremarkable HEART:  PMI not displaced or sustained,S1 and S2 within normal limits, no S3, no S4, no clicks, no rubs, no murmurs ABD:  Flat, positive bowel sounds normal in frequency in pitch, no bruits, no rebound, no guarding, no midline pulsatile mass, no hepatomegaly,  no splenomegaly EXT:  2 plus pulses throughout, no edema, no cyanosis no clubbing SKIN:  No rashes no nodules NEURO:  Cranial nerves II through XII grossly intact, motor grossly intact throughout PSYCH:  Cognitively intact, oriented to person place and time    EKG:  EKG is ordered today. The ekg ordered today demonstrates sinus rhythm, rate 64, axis within normal limits, intervals within normal limits, no acute ST-T wave changes.   Recent Labs: 02/18/2020: ALT 43 03/24/2020: BUN 13; Creatinine, Ser 0.50; Hemoglobin 12.9; Platelets 255; Potassium 3.5; Sodium 139    Lipid Panel No results found for: CHOL, TRIG, HDL, CHOLHDL, VLDL, LDLCALC, LDLDIRECT    Wt Readings from Last 3 Encounters:  05/12/20 181 lb 12.8 oz (82.5 kg)  03/24/20 180 lb (81.6 kg)  02/17/20 181 lb 6.4 oz (82.3 kg)      Other studies Reviewed: Additional studies/ records that were reviewed today include: Labs primary care notes. Review of the above records demonstrates:  Please see elsewhere in the note.     ASSESSMENT AND PLAN:  CHEST PAIN: I think this is atypical.  The pretest probability of obstructive coronary disease is somewhat low. I will bring the patient back for a POET (Plain Old Exercise Test). This will allow me to screen for obstructive coronary disease, risk stratify and very importantly provide a prescription for exercise.  I would suggest that if this is negative she see GI because some of her symptoms are worsened by things like eating watermelon.  DYSLIPIDEMIA: Her LDL 139 we talked about this.  We talked about diet.  She also has an A1c was 6.0 and I mentioned this to her as well.  She does have hypertriglyceridemia.  We talked about a low carbohydrate diet.  COVID EDUCATION: She has not been vaccinated.  Current medicines are reviewed at length with the patient today.  The patient does not have concerns regarding medicines.  The following changes have been made:  no change  Labs/ tests  ordered today include:   Orders Placed This Encounter  Procedures  . C-reactive protein  . Sed Rate (ESR)  . EXERCISE TOLERANCE TEST (ETT)  . EKG 12-Lead     Disposition:   FU with me as needed and based on the results of the above testing.   Signed, Minus Breeding, MD  05/12/2020 10:22 AM    [facility]

## 2020-05-12 ENCOUNTER — Other Ambulatory Visit: Payer: Self-pay | Admitting: Cardiology

## 2020-05-12 ENCOUNTER — Encounter: Payer: Self-pay | Admitting: Cardiology

## 2020-05-12 ENCOUNTER — Other Ambulatory Visit: Payer: Self-pay

## 2020-05-12 ENCOUNTER — Ambulatory Visit (INDEPENDENT_AMBULATORY_CARE_PROVIDER_SITE_OTHER): Payer: BC Managed Care – PPO | Admitting: Cardiology

## 2020-05-12 ENCOUNTER — Other Ambulatory Visit: Payer: BC Managed Care – PPO | Admitting: *Deleted

## 2020-05-12 VITALS — BP 113/72 | HR 67 | Ht 62.0 in | Wt 181.8 lb

## 2020-05-12 DIAGNOSIS — R079 Chest pain, unspecified: Secondary | ICD-10-CM | POA: Diagnosis not present

## 2020-05-12 DIAGNOSIS — Z7189 Other specified counseling: Secondary | ICD-10-CM | POA: Diagnosis not present

## 2020-05-12 LAB — C-REACTIVE PROTEIN: CRP: 5 mg/L (ref 0–10)

## 2020-05-12 LAB — SEDIMENTATION RATE: Sed Rate: 16 mm/hr (ref 0–32)

## 2020-05-12 LAB — TROPONIN I (HIGH SENSITIVITY): Troponin I (High Sensitivity): 5 ng/L (ref <18)

## 2020-05-12 NOTE — Addendum Note (Signed)
Addended by: Teressa Senter on: 05/12/2020 11:08 AM   Modules accepted: Orders

## 2020-05-12 NOTE — Patient Instructions (Signed)
Medication Instructions:  NO CHANGES *If you need a refill on your cardiac medications before your next appointment, please call your pharmacy*  Lab Work: Your physician recommends that you return for lab work TODAY (CRP, SED RATE, TROPONIN I)  Testing/Procedures: Your physician has requested that you have an exercise tolerance test. For further information please visit [email or web address] Please also follow instruction sheet, as given.  You will need a Covid Screening 3 days prior to your exercise tolerance test. You will need to self quarantine after the screening until your procedure. This is a Drive Up Visit at the [facility] [address] [place]. Someone will direct you to the appropriate testing line. Stay in your car and someone will be with you shortly  Follow-Up: At [facility], you and your health needs are our priority.  As part of our continuing mission to provide you with exceptional heart care, we have created designated Provider Care Teams.  These Care Teams include your primary Cardiologist (physician) and Advanced Practice Providers (APPs -  Physician Assistants and Nurse Practitioners) who all work together to provide you with the care you need, when you need it.  We recommend signing up for the patient portal called "MyChart".  Sign up information is provided on this After Visit Summary.  MyChart is used to connect with patients for Virtual Visits (Telemedicine).  Patients are able to view lab/test results, encounter notes, upcoming appointments, etc.  Non-urgent messages can be sent to your provider as well.   To learn more about what you can do with MyChart, go to [email or web address].    Your next appointment:   FOLLOW UP AS NEEDED

## 2020-05-15 ENCOUNTER — Telehealth: Payer: Self-pay | Admitting: Cardiology

## 2020-05-15 NOTE — Telephone Encounter (Signed)
pt aware of lab results and copy sent to Dr Cliffton Asters .Zack Seal   Results Troponin I (High Sensitivity) (Order [id number]) Links  ED Chest Pain hs-TnI Algorithm  Inpatient Chest Pain hs-TnI Algorithm  Troponin I (High Sensitivity) Order: [id number] Status:  Final result Visible to patient:  No (inaccessible in MyChart) Next appt:  05/26/2020 at 12:20 PM in Radiology (GI-315 MR 2) Dx:  Chest pain of uncertain etiology  Ref Range & Units 3 d ago  (05/12/20) 1 mo ago  (03/24/20) 1 mo ago  (03/24/20)  Troponin I (High Sensitivity) <18 ng/L 5  2 CM  5 CM   Comment: (NOTE)  Elevated high sensitivity troponin I (hsTnI) values and significant  changes across serial measurements may suggest ACS but many other  chronic and acute conditions are known to elevate hsTnI results.  Refer to the "Links" section for chest pain algorithms and additional  guidance.  Performed at [facility], [address], [place], [place]  [address]   Resulting Agency  [facility] CLIN LAB [facility]      Specimen Collected: 05/12/20 12:16 Last Resulted: 05/12/20 12:50     Lab Flowsheet   Order Details   View Encounter   Lab and Collection Details   Routing   Result History     CM=Additional comments    Result Communications  Result Notes  Philis Pique  05/13/2020 9:53 AM EDT    Left message for patient to call back.    Rollene Rotunda, MD  05/13/2020 7:46 AM EDT    Negative. Call Ms. Ambs with the results and send results to Laurann Montana, MD        Troponin I (High Sensitivity): Result Notes  Teressa Senter RN  05/13/2020 9:53 AM EDT    Left message for patient to call back.    Rollene Rotunda, MD  05/13/2020 7:46 AM EDT    Negative. Call Ms. Whetstone with the results and send results to Laurann Montana, MD      All Reviewers List  Rollene Rotunda, MD on 05/13/2020 07:46  Encounter  View Encounter       Result Information  Status: Final result (Collected:  05/12/2020 12:16) Provider Status: Reviewed    Lab Information  [facility]    Order-Level Documents:  There are no order-level documents.

## 2020-05-15 NOTE — Telephone Encounter (Signed)
New message  Patient is returning your call for lab results. Please call. 

## 2020-05-19 DIAGNOSIS — T7840XA Allergy, unspecified, initial encounter: Secondary | ICD-10-CM | POA: Diagnosis not present

## 2020-05-26 ENCOUNTER — Ambulatory Visit
Admission: RE | Admit: 2020-05-26 | Discharge: 2020-05-26 | Disposition: A | Discharge status: Home / Self Care | Payer: BC Managed Care – PPO | Source: Ambulatory Visit | Attending: Family Medicine | Admitting: Family Medicine

## 2020-05-26 DIAGNOSIS — R519 Headache, unspecified: Secondary | ICD-10-CM

## 2020-05-26 MED ORDER — GADOBENATE DIMEGLUMINE 529 MG/ML IV SOLN
17.0000 mL | Freq: Once | INTRAVENOUS | Status: AC | PRN
  Administered 2020-05-26: 17 mL via INTRAVENOUS

## 2020-05-30 ENCOUNTER — Telehealth (HOSPITAL_COMMUNITY): Payer: Self-pay

## 2020-05-30 NOTE — Telephone Encounter (Signed)
Encounter complete. 

## 2020-05-31 ENCOUNTER — Other Ambulatory Visit (HOSPITAL_COMMUNITY)
Admission: RE | Admit: 2020-05-31 | Discharge: 2020-05-31 | Disposition: A | Discharge status: Home / Self Care | Payer: BC Managed Care – PPO | Source: Ambulatory Visit | Attending: Cardiology | Admitting: Cardiology

## 2020-05-31 ENCOUNTER — Other Ambulatory Visit (HOSPITAL_COMMUNITY): Payer: BC Managed Care – PPO

## 2020-05-31 DIAGNOSIS — Z20822 Contact with and (suspected) exposure to covid-19: Secondary | ICD-10-CM | POA: Insufficient documentation

## 2020-05-31 DIAGNOSIS — Z01812 Encounter for preprocedural laboratory examination: Secondary | ICD-10-CM | POA: Insufficient documentation

## 2020-05-31 LAB — SARS CORONAVIRUS 2 (TAT 6-24 HRS): SARS Coronavirus 2: NEGATIVE

## 2020-06-04 ENCOUNTER — Ambulatory Visit (HOSPITAL_COMMUNITY)
Admission: RE | Admit: 2020-06-04 | Discharge: 2020-06-04 | Disposition: A | Discharge status: Home / Self Care | Payer: BC Managed Care – PPO | Source: Ambulatory Visit | Attending: Cardiovascular Disease | Admitting: Cardiovascular Disease

## 2020-06-04 ENCOUNTER — Other Ambulatory Visit: Payer: Self-pay

## 2020-06-04 DIAGNOSIS — R079 Chest pain, unspecified: Secondary | ICD-10-CM | POA: Diagnosis not present

## 2020-06-04 LAB — EXERCISE TOLERANCE TEST
Estimated workload: 10.1 METS
Exercise duration (min): 9 min
Exercise duration (sec): 1 s
MPHR: 174 {beats}/min
Peak HR: 171 {beats}/min
Percent HR: 98 %
Rest HR: 63 {beats}/min

## 2020-06-10 ENCOUNTER — Telehealth: Payer: Self-pay | Admitting: General Practice

## 2020-06-10 NOTE — Telephone Encounter (Signed)
She called because she had a missed call from our number

## 2020-07-07 DIAGNOSIS — Z01818 Encounter for other preprocedural examination: Secondary | ICD-10-CM | POA: Diagnosis not present

## 2020-07-07 DIAGNOSIS — R1314 Dysphagia, pharyngoesophageal phase: Secondary | ICD-10-CM | POA: Diagnosis not present

## 2020-07-07 DIAGNOSIS — K219 Gastro-esophageal reflux disease without esophagitis: Secondary | ICD-10-CM | POA: Diagnosis not present

## 2020-07-17 ENCOUNTER — Encounter: Payer: Self-pay | Admitting: *Deleted

## 2020-07-21 ENCOUNTER — Encounter: Payer: Self-pay | Admitting: Diagnostic Neuroimaging

## 2020-07-21 ENCOUNTER — Telehealth: Payer: Self-pay | Admitting: *Deleted

## 2020-07-21 ENCOUNTER — Ambulatory Visit: Payer: BC Managed Care – PPO | Admitting: Diagnostic Neuroimaging

## 2020-07-21 NOTE — Telephone Encounter (Signed)
Patient was no show for new patient appointment today. 

## 2020-07-28 ENCOUNTER — Ambulatory Visit: Payer: BC Managed Care – PPO | Admitting: Diagnostic Neuroimaging

## 2020-09-03 DIAGNOSIS — Z1159 Encounter for screening for other viral diseases: Secondary | ICD-10-CM | POA: Diagnosis not present

## 2020-09-08 DIAGNOSIS — K3189 Other diseases of stomach and duodenum: Secondary | ICD-10-CM | POA: Diagnosis not present

## 2020-09-08 DIAGNOSIS — B9681 Helicobacter pylori [H. pylori] as the cause of diseases classified elsewhere: Secondary | ICD-10-CM | POA: Diagnosis not present

## 2020-09-08 DIAGNOSIS — K2930 Chronic superficial gastritis without bleeding: Secondary | ICD-10-CM | POA: Diagnosis not present

## 2020-09-08 DIAGNOSIS — R1310 Dysphagia, unspecified: Secondary | ICD-10-CM | POA: Diagnosis not present

## 2020-09-08 DIAGNOSIS — K219 Gastro-esophageal reflux disease without esophagitis: Secondary | ICD-10-CM | POA: Diagnosis not present

## 2020-09-08 DIAGNOSIS — Z1211 Encounter for screening for malignant neoplasm of colon: Secondary | ICD-10-CM | POA: Diagnosis not present

## 2020-10-20 ENCOUNTER — Ambulatory Visit: Payer: BC Managed Care – PPO | Admitting: Diagnostic Neuroimaging

## 2020-10-23 ENCOUNTER — Encounter: Payer: Self-pay | Admitting: Diagnostic Neuroimaging

## 2021-04-09 IMAGING — DX DG CHEST 2V
2 series · 2 of 2 positions shown · non-contrast
Comparison: Chest radiograph dated 02/16/2016.

CLINICAL DATA: 43-year-old female with shortness of breath.
Positive [id number]

EXAM:
CHEST - 2 VIEW

[chest pa]
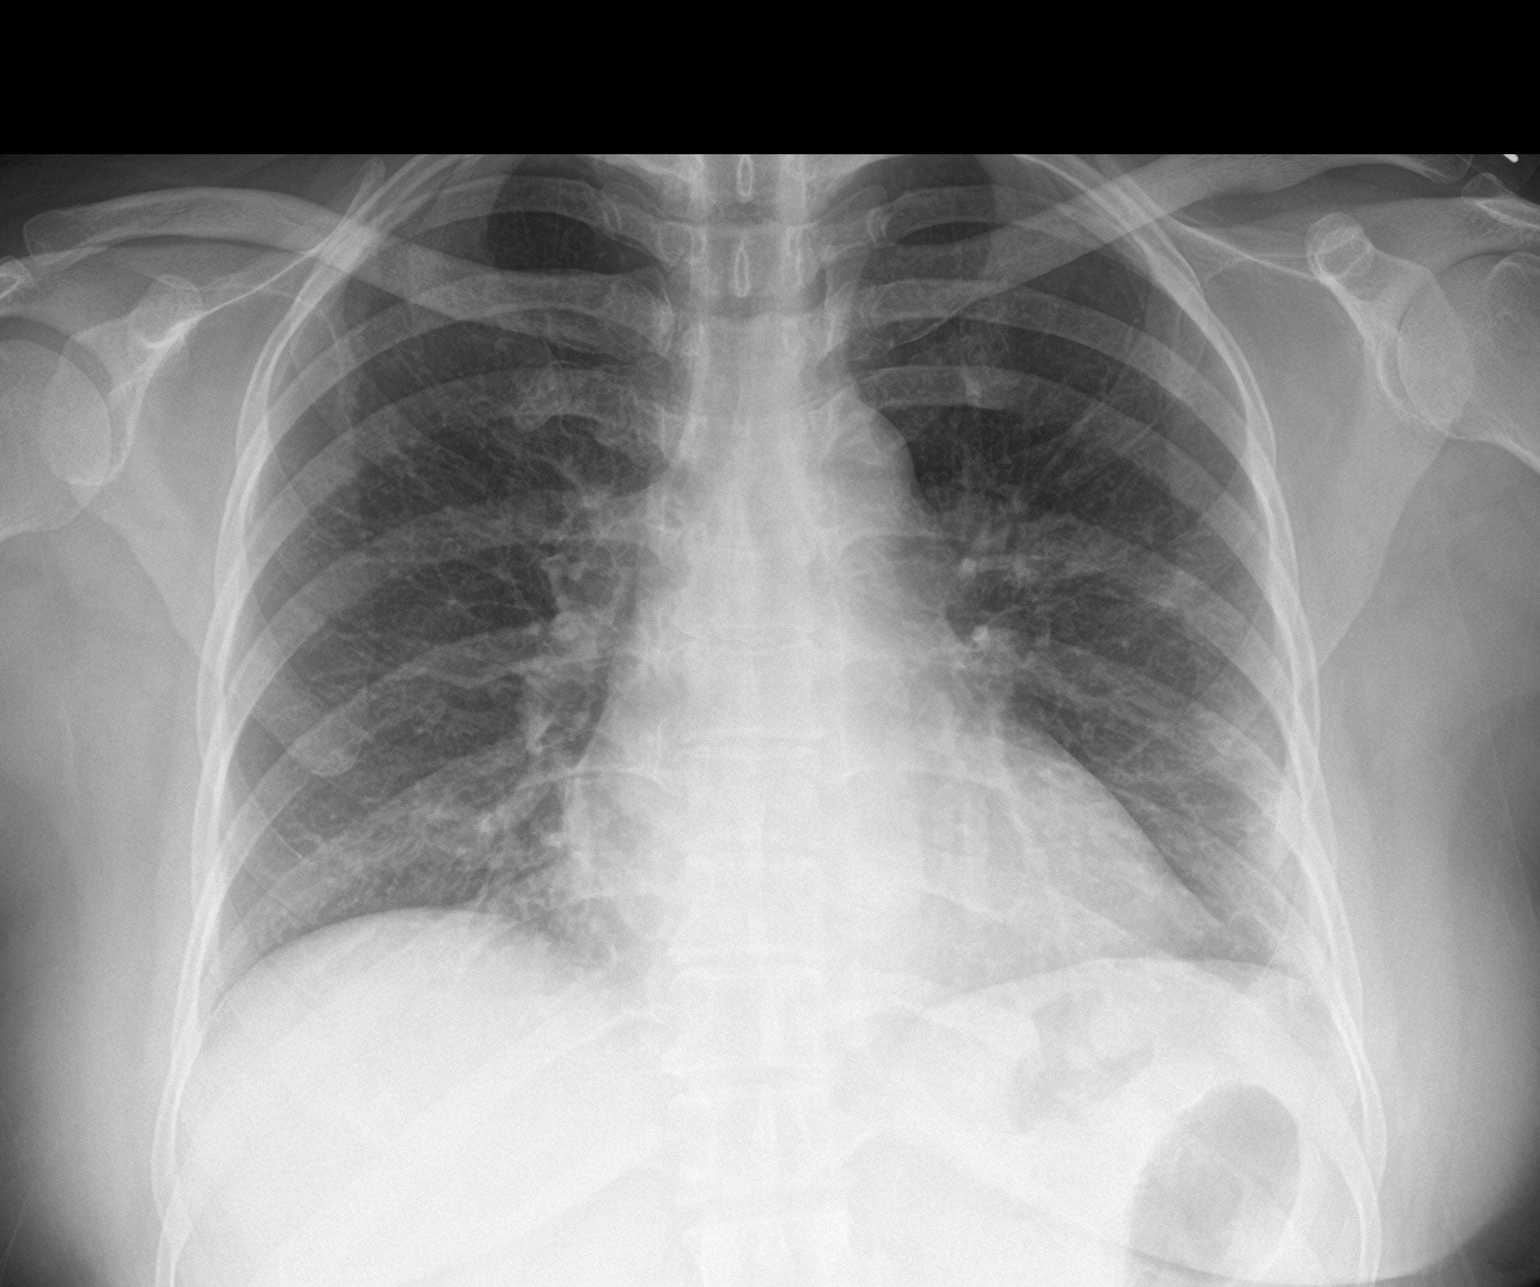

[chest lat]
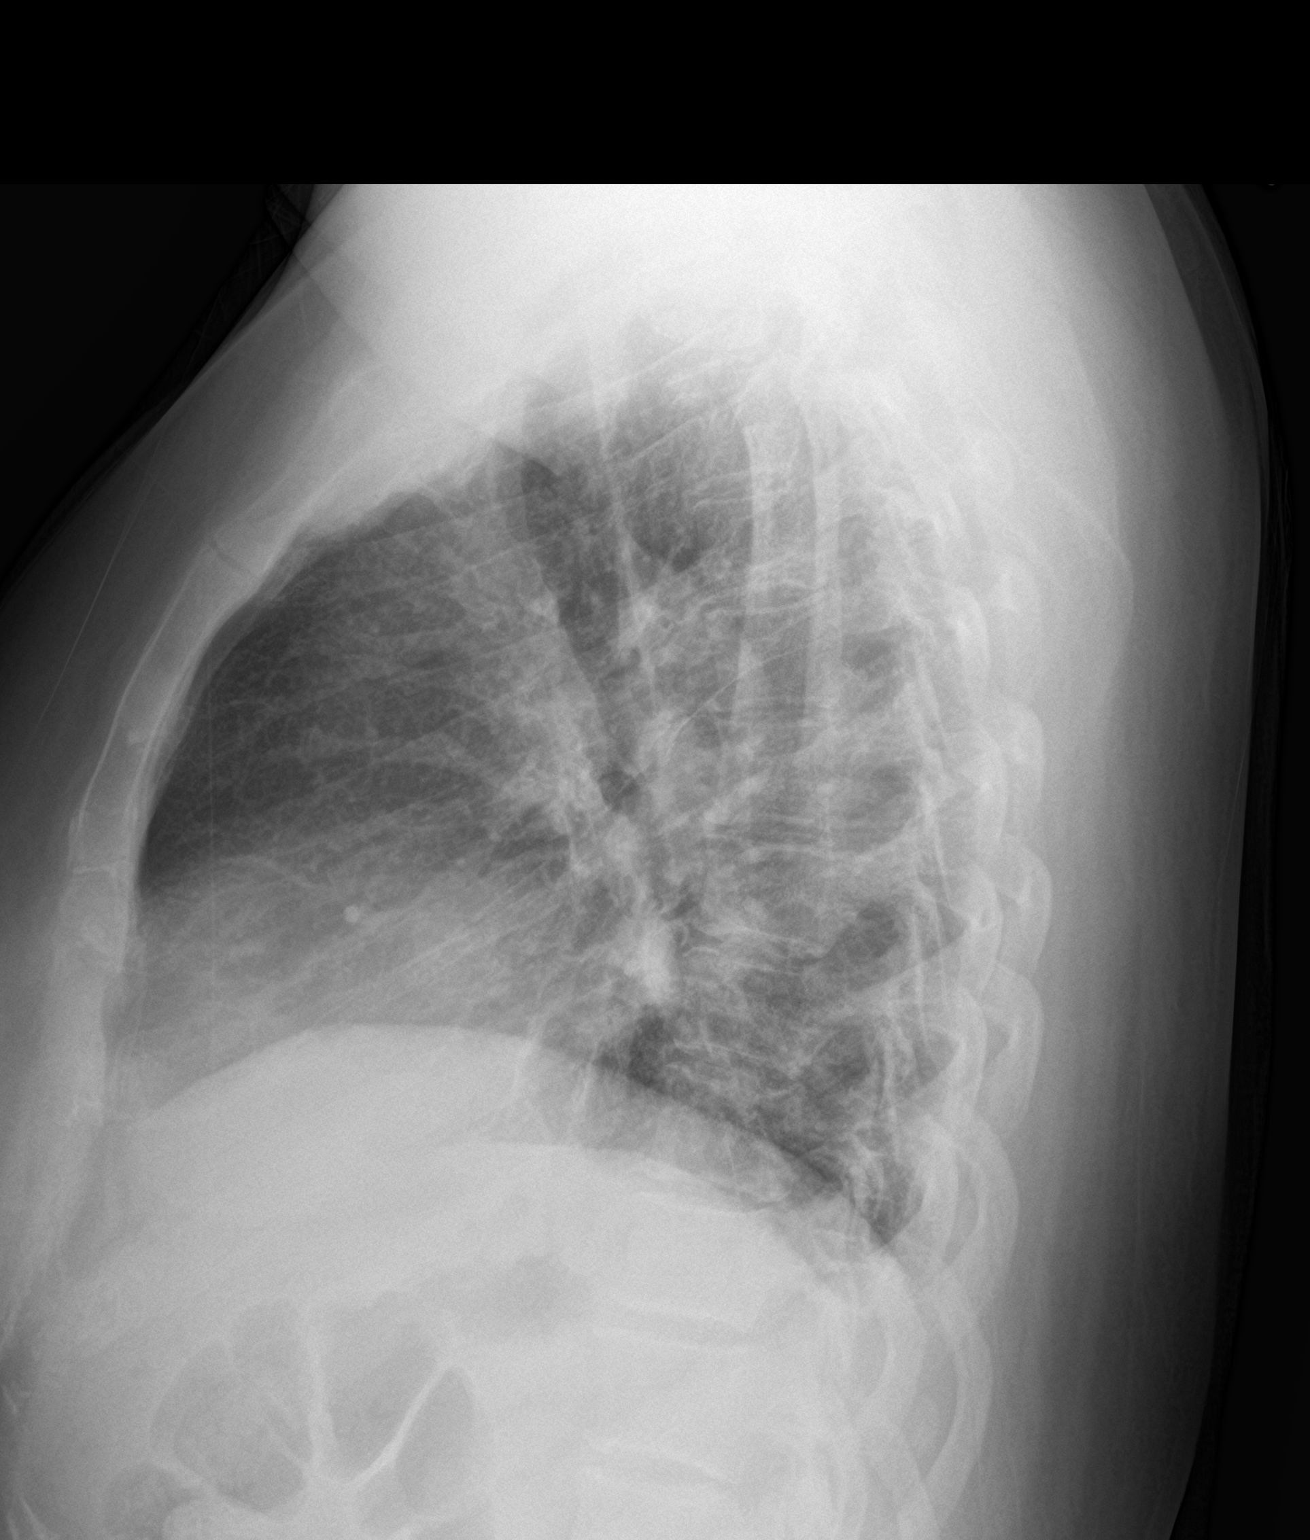

[2 of 2 positions shown; findings below may reference images not displayed]

FINDINGS: Faint peripheral and subpleural hazy densities primarily in the left
lower lung field most consistent with pneumonia, likely viral and
atypical in etiology and in keeping with [id number]. Clinical
correlation and follow-up recommended. No lobar consolidation,
pleural effusion or pneumothorax. The cardiac silhouette is within
normal limits. No acute osseous pathology.
IMPRESSION: Findings most consistent with [id number]-WB. Clinical correlation and
follow-up recommended.

## 2021-04-10 IMAGING — DX DG CHEST 1V PORT
1 series · 1 of 1 positions shown · non-contrast
Comparison: February 17, 2020

CLINICAL DATA: COVID positive test on February 09, 2020. Cough and
chest pressure.

EXAM:
PORTABLE CHEST 1 VIEW

[chest ap]
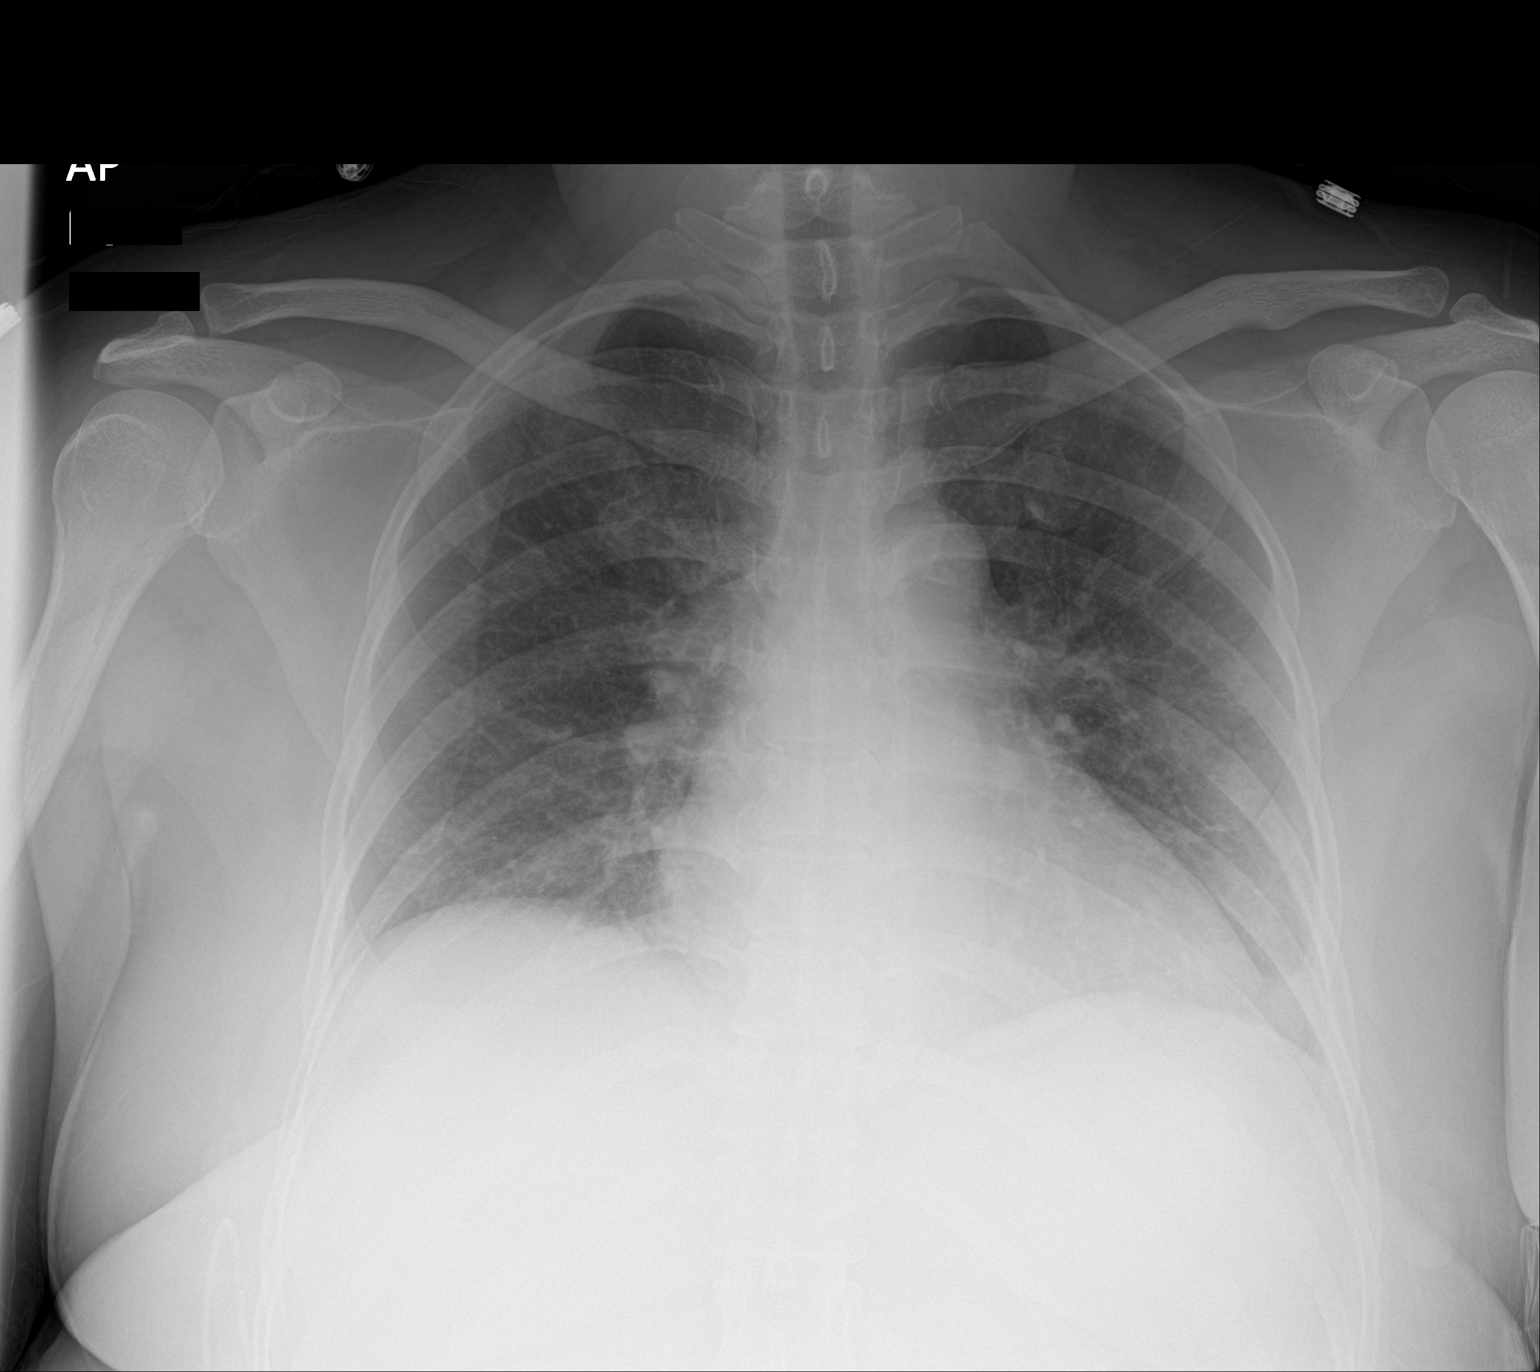

[1 of 1 positions shown; findings below may reference images not displayed]

FINDINGS: Stable apparent mild cardiomegaly with central pulmonary vascular
congestion that could be secondary to the low lung volumes. Mild
interstitial edema, similar to the prior study. No obvious pleural
effusion or consolidation. No pneumothorax. Minimal degenerative
vertebral body endplate changes.
IMPRESSION: Similar mild interstitial edema consistent with viral infection. No
pneumonia or obvious pleural effusion. Shallow inspiration.

## 2021-05-15 IMAGING — DX DG CHEST 2V
2 series · 2 of 2 positions shown · non-contrast
Comparison: 03/04/2020

CLINICAL DATA: Chest pain.  Mid chest pain that started yesterday

EXAM:
CHEST - 2 VIEW

[chest pa]
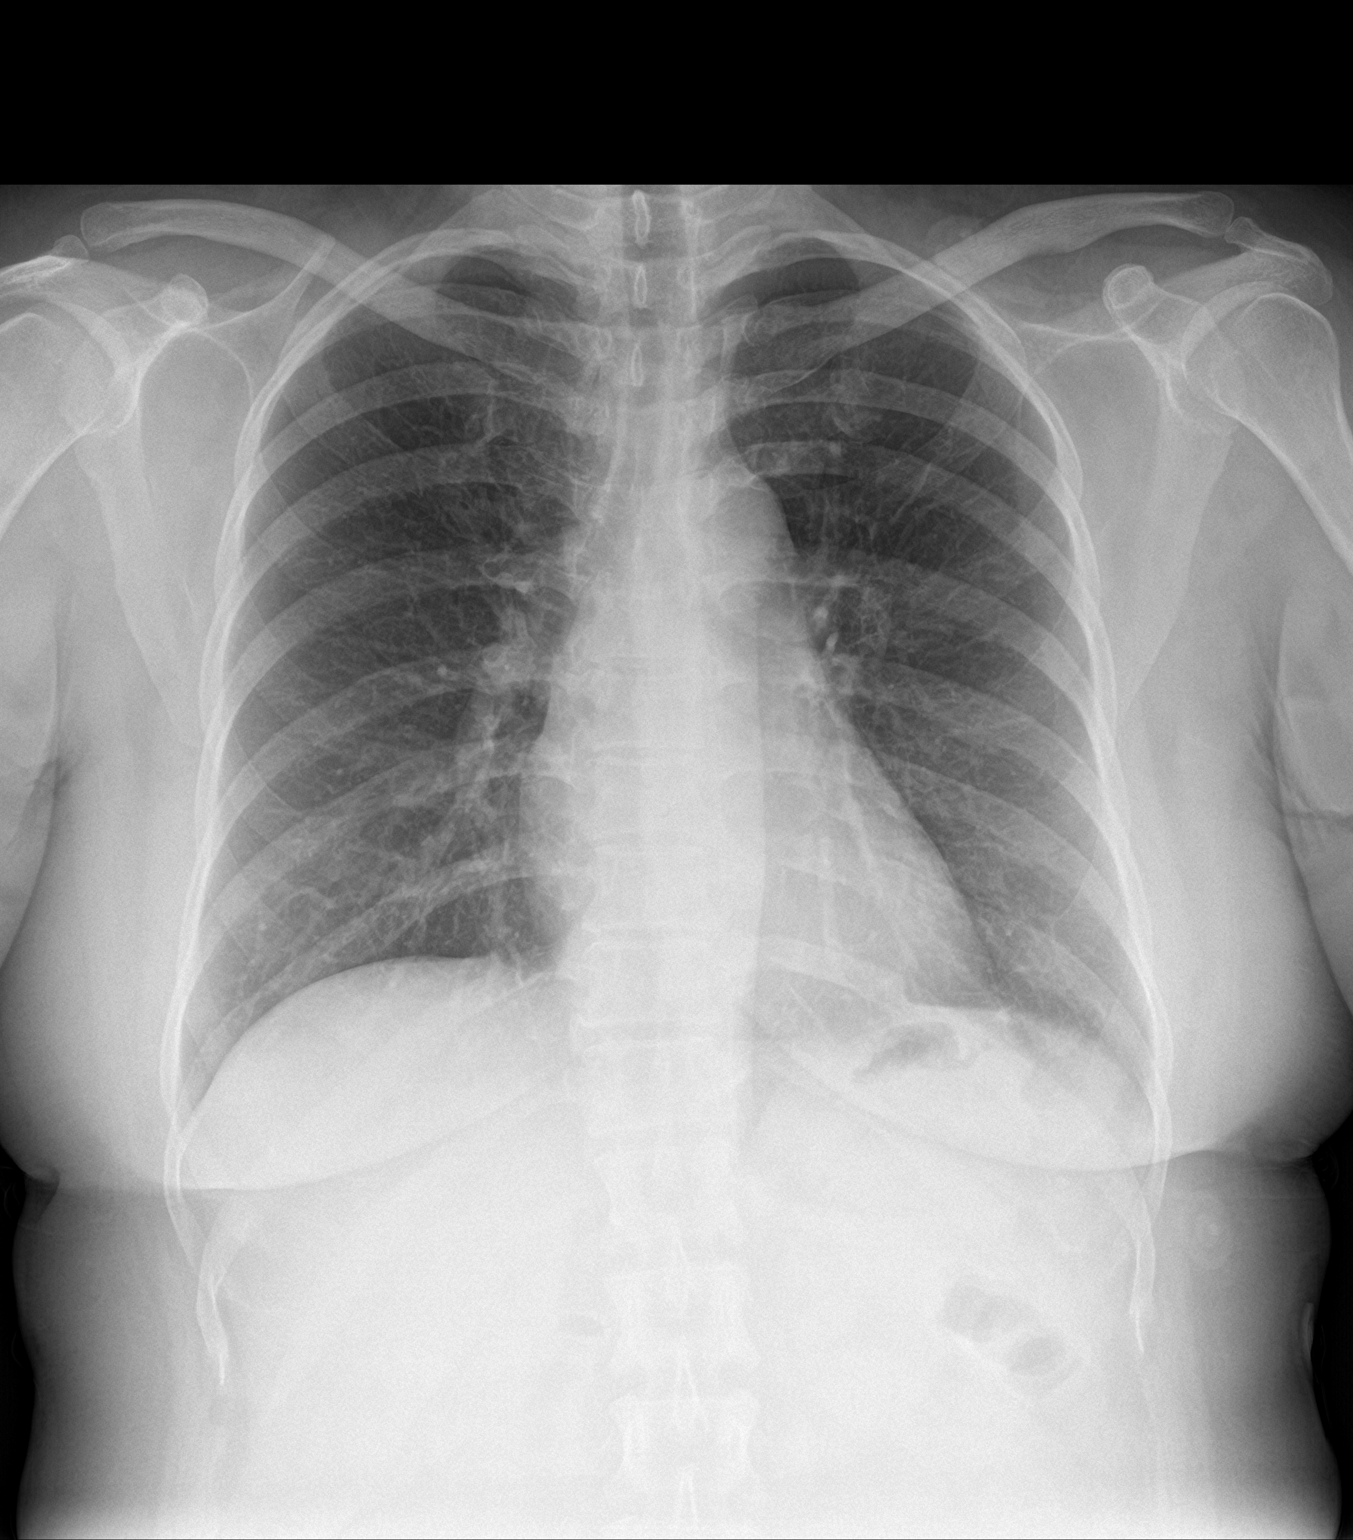

[chest lat]
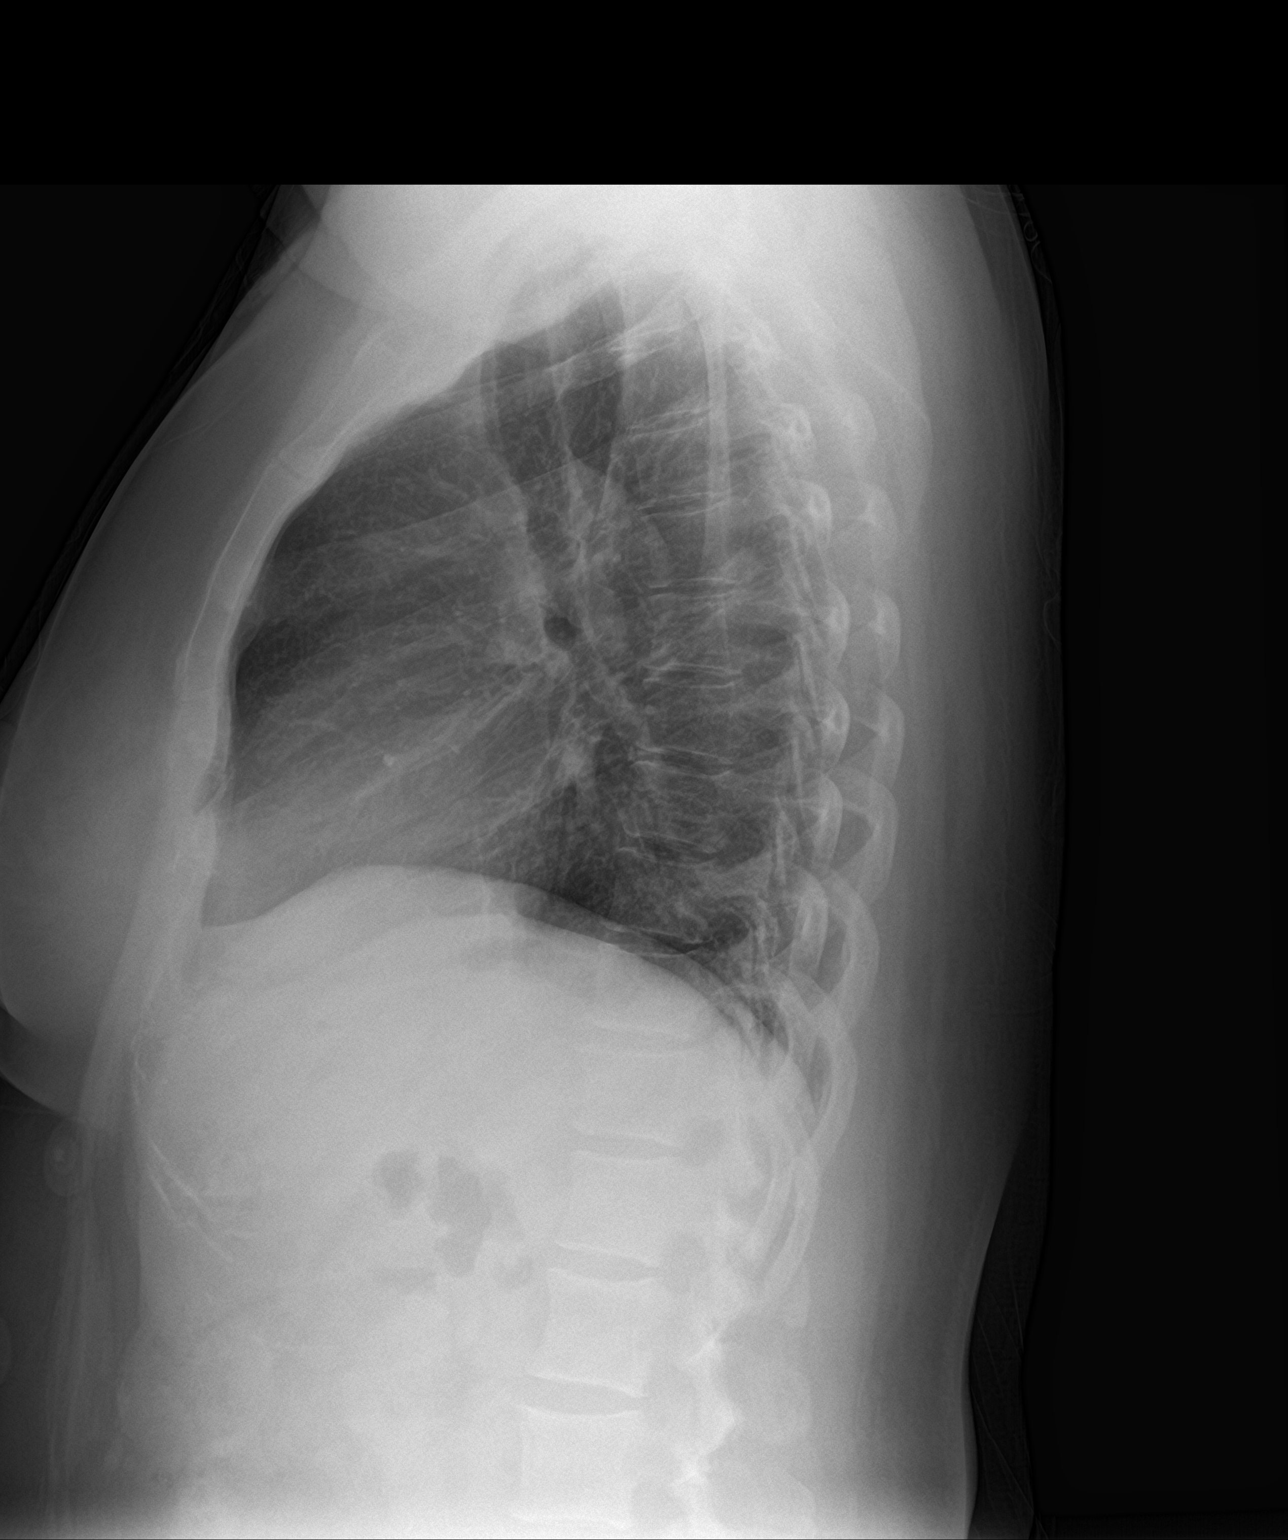

[2 of 2 positions shown; findings below may reference images not displayed]

FINDINGS: Cardiomediastinal contours and hilar structures are normal.

Lungs are clear.  No pleural effusion.

Visualized skeletal structures are normal.
IMPRESSION: Normal chest.

## 2021-11-30 ENCOUNTER — Other Ambulatory Visit: Payer: Self-pay

## 2021-11-30 ENCOUNTER — Ambulatory Visit
Admission: EM | Admit: 2021-11-30 | Discharge: 2021-11-30 | Disposition: A | Discharge status: Home / Self Care | Payer: BC Managed Care – PPO | Attending: Internal Medicine | Admitting: Internal Medicine

## 2021-11-30 ENCOUNTER — Encounter: Payer: Self-pay | Admitting: Emergency Medicine

## 2021-11-30 DIAGNOSIS — R051 Acute cough: Secondary | ICD-10-CM

## 2021-11-30 DIAGNOSIS — R0602 Shortness of breath: Secondary | ICD-10-CM

## 2021-11-30 DIAGNOSIS — R0789 Other chest pain: Secondary | ICD-10-CM

## 2021-11-30 MED ORDER — BENZONATATE 100 MG PO CAPS: 100.0000 mg | ORAL_CAPSULE | Freq: Three times a day (TID) | ORAL | 0 refills | Status: DC | PRN

## 2021-11-30 MED ORDER — PREDNISONE 20 MG PO TABS: 40.0000 mg | ORAL_TABLET | Freq: Every day | ORAL | 0 refills | Status: AC

## 2021-11-30 MED ORDER — ALBUTEROL SULFATE HFA 108 (90 BASE) MCG/ACT IN AERS: 1.0000 | INHALATION_SPRAY | Freq: Four times a day (QID) | RESPIRATORY_TRACT | 0 refills | Status: DC | PRN

## 2021-11-30 NOTE — ED Provider Notes (Signed)
[facility]    CSN: [id number] Arrival date & time: 11/30/21  1135      History   Chief Complaint Chief Complaint  Patient presents with   Chest Pain   Shortness of Breath    HPI Pamela Moreno is a 45 y.o. female.   Patient presents with chest pain, shortness of breath, cough, wheezing that started approximately 2 days ago.  Patient reports the chest pain is centralized and does not radiate.  Chest pain is intermittent, is described as a sharp pain, and only occurs when she coughs.  Patient reports the cough started approximately 2 days ago and is nonproductive.  Also having some constant shortness of breath but denies any chronic lung diseases such as asthma or COPD.  Having occasional wheezing as well.  Denies any upper respiratory symptoms or fever but does report that she has felt increasingly fatigued lately.  No aggravating factors to shortness of breath or other symptoms.  Patient has not yet taken any over-the-counter medications to help alleviate symptoms.  Patient denies any cardiac problems but was seen in 2021 for chest pain and was diagnosed with atypical chest pain.   Chest Pain Shortness of Breath  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, did not complete full tx   New onset headache    Paresthesias    Vitamin D deficiency     Patient Active Problem List   Diagnosis Date Noted   Chest pain of uncertain etiology 05/10/2020   Educated about COVID-19 virus infection 05/10/2020    Past Surgical History:  Procedure Laterality Date   LASIK  2005   tummy tuck  2018    OB History   No obstetric history on file.      Home Medications    Prior to Admission medications   Medication Sig Start Date End Date Taking? Authorizing Provider  albuterol (VENTOLIN HFA) 108 (90 Base) MCG/ACT inhaler  Inhale 1-2 puffs into the lungs every 6 (six) hours as needed for wheezing or shortness of breath. 11/30/21  Yes , Rolly Salter E, FNP  benzonatate (TESSALON) 100 MG capsule Take 1 capsule (100 mg total) by mouth every 8 (eight) hours as needed for cough. 11/30/21  Yes , Rolly Salter E, FNP  predniSONE (DELTASONE) 20 MG tablet Take 2 tablets (40 mg total) by mouth daily for 5 days. 11/30/21 12/05/21 Yes , Acie Fredrickson, FNP  LORazepam (ATIVAN) 0.5 MG tablet Take 0.25-0.5 mg by mouth 3 (three) times daily as needed. 04/15/20   [provider]  pantoprazole (PROTONIX) 40 MG tablet Take 40 mg by mouth daily. 01/23/20   [provider]  esomeprazole (NEXIUM) 40 MG capsule Take 1 capsule (40 mg total) by mouth daily. 04/04/18 02/17/20  Belinda Fisher, PA-C  omeprazole (PRILOSEC) 10 MG capsule Take by mouth.  02/17/20  [provider]    Family History Family History  Problem Relation Age of Onset   Cancer Mother    Hypertension Father    Hyperlipidemia Father     Social History Social History   Tobacco Use   Smoking status: Never   Smokeless tobacco: Never  Substance Use Topics   Alcohol use: Never   Drug use: Never     Allergies  Penicillins and Sulfa antibiotics   Review of Systems Review of Systems Per HPI  Physical Exam Triage Vital Signs ED Triage Vitals  Enc Vitals Group     BP 11/30/21 1150 (!) 149/93     Pulse Rate 11/30/21 1150 75     Resp 11/30/21 1150 16     Temp 11/30/21 1150 98.2 F (36.8 C)     Temp Source 11/30/21 1150 Oral     SpO2 11/30/21 1150 97 %     Weight --      Height --      Head Circumference --      Peak Flow --      Pain Score 11/30/21 1151 0     Pain Loc --      Pain Edu? --      Excl. in [place]? --    No data found.  Updated Vital Signs BP (!) 149/93 (BP Location: Left Arm)   Pulse 75   Temp 98.2 F (36.8 C) (Oral)   Resp 16   SpO2 97%   Visual Acuity Right Eye Distance:   Left Eye Distance:   Bilateral Distance:     Right Eye Near:   Left Eye Near:    Bilateral Near:     Physical Exam Constitutional:      General: She is not in acute distress.    Appearance: Normal appearance. She is not toxic-appearing or diaphoretic.  HENT:     Head: Normocephalic and atraumatic.     Right Ear: Tympanic membrane and ear canal normal.     Left Ear: Tympanic membrane and ear canal normal.     Nose: Nose normal. No congestion.     Mouth/Throat:     Mouth: Mucous membranes are moist.     Pharynx: No posterior oropharyngeal erythema.  Eyes:     Extraocular Movements: Extraocular movements intact.     Conjunctiva/sclera: Conjunctivae normal.     Pupils: Pupils are equal, round, and reactive to light.  Cardiovascular:     Rate and Rhythm: Normal rate and regular rhythm.     Pulses: Normal pulses.     Heart sounds: Normal heart sounds.  Pulmonary:     Effort: Pulmonary effort is normal. No respiratory distress.     Breath sounds: Normal breath sounds. No stridor. No wheezing, rhonchi or rales.  Abdominal:     General: Abdomen is flat. Bowel sounds are normal.     Palpations: Abdomen is soft.  Musculoskeletal:        General: Normal range of motion.     Cervical back: Normal range of motion.  Skin:    General: Skin is warm and dry.  Neurological:     General: No focal deficit present.     Mental Status: She is alert and oriented to person, place, and time. Mental status is at baseline.  Psychiatric:        Mood and Affect: Mood normal.        Behavior: Behavior normal.     UC Treatments / Results  Labs (all labs ordered are listed, but only abnormal results are displayed) Labs Reviewed  NOVEL CORONAVIRUS, NAA    EKG   Radiology No results found.  Procedures Procedures (including critical care time)  Medications Ordered in UC Medications - No data to display  Initial Impression / Assessment and Plan / UC Course  I have reviewed the triage vital signs and the nursing notes.  Pertinent  labs & imaging results  that were available during my care of the patient were reviewed by me and considered in my medical decision making (see chart for details).     Suspect viral cause to patient's symptoms.  No adventitious lung sounds on exam, oxygen is normal, and no signs of respiratory compromise.  Do not think that patient is in need of immediate medical attention at the hospital at this time.  Chest x-ray ordered at outpatient imaging center given that there is no x-ray tech in urgent care today.  EKG normal sinus rhythm.  COVID-19 test pending.  Will prescribe prednisone steroid x5 days, albuterol inhaler, benzonatate to take as needed for cough.  Advised patient to go to the hospital if symptoms worsen.  Discussed strict return precautions.  Patient verbalized understanding and was agreeable with plan. Final Clinical Impressions(s) / UC Diagnoses   Final diagnoses:  Acute cough  Shortness of breath  Other chest pain     Discharge Instructions      A chest x-ray has been ordered for you.  Please go to [facility] imaging on [address] to have this x-ray completed.  We will call you if there are any abnormalities.  You have been prescribed 3 medications to help alleviate your symptoms.  COVID-19 test is pending.  We will call if it is positive.    ED Prescriptions     Medication Sig Dispense Auth. Provider   predniSONE (DELTASONE) 20 MG tablet Take 2 tablets (40 mg total) by mouth daily for 5 days. 10 tablet [place], Hendricks E, [place]   benzonatate (TESSALON) 100 MG capsule Take 1 capsule (100 mg total) by mouth every 8 (eight) hours as needed for cough. 21 capsule [place], [place] E, [place]   albuterol (VENTOLIN HFA) 108 (90 Base) MCG/ACT inhaler Inhale 1-2 puffs into the lungs every 6 (six) hours as needed for wheezing or shortness of breath. 1 each Teodora Medici, [place]      PDMP not reviewed this encounter.   Teodora Medici, [place] 11/30/21 1330

## 2021-11-30 NOTE — Discharge Instructions (Signed)
A chest x-ray has been ordered for you.  Please go to [facility] imaging on [address] to have this x-ray completed.  We will call you if there are any abnormalities.  You have been prescribed 3 medications to help alleviate your symptoms.  COVID-19 test is pending.  We will call if it is positive.

## 2021-11-30 NOTE — ED Triage Notes (Signed)
Started having central chest pain 2 days ago, intermittent. Reports feeling fatigued, exhausted, short of breath, new mild cough starting with this. Laying down and exertion worsen pain, denies any relieving factors. Pain radiates into head/left arm when it happens. Denies history of heart problems. Denies nasal congestion/runny nose. Reports hoarseness with this and mucus in her chest.

## 2021-12-01 LAB — NOVEL CORONAVIRUS, NAA: SARS-CoV-2, NAA: NOT DETECTED

## 2022-11-16 ENCOUNTER — Emergency Department (HOSPITAL_COMMUNITY): Payer: Commercial Managed Care - HMO

## 2022-11-16 ENCOUNTER — Other Ambulatory Visit: Payer: Self-pay

## 2022-11-16 ENCOUNTER — Emergency Department (HOSPITAL_COMMUNITY)
Admission: EM | Admit: 2022-11-16 | Discharge: 2022-11-17 | Disposition: A | Discharge status: Home / Self Care | Payer: Commercial Managed Care - HMO | Attending: Emergency Medicine | Admitting: Emergency Medicine

## 2022-11-16 DIAGNOSIS — G44219 Episodic tension-type headache, not intractable: Secondary | ICD-10-CM | POA: Diagnosis not present

## 2022-11-16 DIAGNOSIS — M62838 Other muscle spasm: Secondary | ICD-10-CM | POA: Insufficient documentation

## 2022-11-16 DIAGNOSIS — E876 Hypokalemia: Secondary | ICD-10-CM | POA: Insufficient documentation

## 2022-11-16 DIAGNOSIS — M542 Cervicalgia: Secondary | ICD-10-CM | POA: Insufficient documentation

## 2022-11-16 DIAGNOSIS — M546 Pain in thoracic spine: Secondary | ICD-10-CM | POA: Insufficient documentation

## 2022-11-16 DIAGNOSIS — R519 Headache, unspecified: Secondary | ICD-10-CM | POA: Diagnosis present

## 2022-11-16 LAB — COMPREHENSIVE METABOLIC PANEL
ALT: 32 U/L (ref 0–44)
AST: 30 U/L (ref 15–41)
Albumin: 3.7 g/dL (ref 3.5–5.0)
Alkaline Phosphatase: 70 U/L (ref 38–126)
Anion gap: 6 (ref 5–15)
BUN: 16 mg/dL (ref 6–20)
CO2: 24 mmol/L (ref 22–32)
Calcium: 9.1 mg/dL (ref 8.9–10.3)
Chloride: 107 mmol/L (ref 98–111)
Creatinine, Ser: 0.54 mg/dL (ref 0.44–1.00)
GFR, Estimated: >60 mL/min (ref >60)
Glucose, Bld: 101 mg/dL — ABNORMAL HIGH (ref 70–99)
Potassium: 3.2 mmol/L — ABNORMAL LOW (ref 3.5–5.1)
Sodium: 137 mmol/L (ref 135–145)
Total Bilirubin: 0.5 mg/dL (ref 0.3–1.2)
Total Protein: 7.5 g/dL (ref 6.5–8.1)

## 2022-11-16 LAB — CBC WITH DIFFERENTIAL/PLATELET
Abs Immature Granulocytes: 0.02 10*3/uL (ref 0.00–0.07)
Basophils Absolute: 0.1 10*3/uL (ref 0.0–0.1)
Basophils Relative: 1 %
Eosinophils Absolute: 0.2 10*3/uL (ref 0.0–0.5)
Eosinophils Relative: 2 %
HCT: 41.2 % (ref 36.0–46.0)
Hemoglobin: 13 g/dL (ref 12.0–15.0)
Immature Granulocytes: 0 %
Lymphocytes Relative: 42 %
Lymphs Abs: 3.5 10*3/uL (ref 0.7–4.0)
MCH: 27.2 pg (ref 26.0–34.0)
MCHC: 31.6 g/dL (ref 30.0–36.0)
MCV: 86.2 fL (ref 80.0–100.0)
Monocytes Absolute: 0.7 10*3/uL (ref 0.1–1.0)
Monocytes Relative: 9 %
Neutro Abs: 3.8 10*3/uL (ref 1.7–7.7)
Neutrophils Relative %: 46 %
Platelets: 198 10*3/uL (ref 150–400)
RBC: 4.78 MIL/uL (ref 3.87–5.11)
RDW: 14.1 % (ref 11.5–15.5)
WBC: 8.3 10*3/uL (ref 4.0–10.5)
nRBC: 0 % (ref 0.0–0.2)

## 2022-11-16 LAB — URINALYSIS, ROUTINE W REFLEX MICROSCOPIC
Bilirubin Urine: NEGATIVE
Glucose, UA: NEGATIVE mg/dL
Hgb urine dipstick: NEGATIVE
Ketones, ur: NEGATIVE mg/dL
Nitrite: NEGATIVE
Protein, ur: NEGATIVE mg/dL
Specific Gravity, Urine: 1.009 (ref 1.005–1.030)
pH: 6 (ref 5.0–8.0)

## 2022-11-16 LAB — CBG MONITORING, ED: Glucose-Capillary: 87 mg/dL (ref 70–99)

## 2022-11-16 MED ORDER — DEXAMETHASONE SODIUM PHOSPHATE 10 MG/ML IJ SOLN: 8.0000 mg | Freq: Once | INTRAMUSCULAR | Status: DC

## 2022-11-16 NOTE — ED Provider Triage Note (Signed)
Emergency Medicine Provider Triage Evaluation Note  Ortencia Askari Cominsky , a 46 y.o. female  was evaluated in triage.  Pt complains of elevated blood pressure and numbness in lips and left hand for several months. Also feels like she is going to pass out at times. No chest pain or SOB. Not on any BP meds. Reports constant headache.   Review of Systems  Positive: headache Negative: Chest pain, SOB  Physical Exam  BP (!) 167/91 (BP Location: Left Arm)   Pulse 62   Temp (!) 97.4 F (36.3 C) (Oral)   Resp 17   Ht 5\' 2"  (1.575 m)   Wt 86.2 kg   SpO2 92%   BMI 34.75 kg/m  Gen:   Awake, no distress   Resp:  Normal effort  MSK:   Moves extremities without difficulty  Other:  No neuro deficits.  Medical Decision Making  Medically screening exam initiated at 9:11 PM.  Appropriate orders placed.  Latrecia Capito John was informed that the remainder of the evaluation will be completed by another provider, this initial triage assessment does not replace that evaluation, and the importance of remaining in the ED until their evaluation is complete.   Mal Amabile, Pete Pelt 11/16/22 2115

## 2022-11-16 NOTE — ED Triage Notes (Signed)
Patient coming to ED for evaluation of multiple complaints. Reports symptoms started "a while ago."  C/o pain to head, aching in bilateral legs, burning in feet, increased BP, and tingling in lips.  States "sometimes it feels like someone has beat me in the head."  No complaints of fever or blurred vision.  No weakness

## 2022-11-17 MED ORDER — POTASSIUM CHLORIDE CRYS ER 20 MEQ PO TBCR
20.0000 meq | EXTENDED_RELEASE_TABLET | Freq: Once | ORAL | Status: AC
  Administered 2022-11-17: 20 meq via ORAL
  Filled 2022-11-17: qty 1

## 2022-11-17 MED ORDER — KETOROLAC TROMETHAMINE 15 MG/ML IJ SOLN
15.0000 mg | Freq: Once | INTRAMUSCULAR | Status: AC
  Administered 2022-11-17: 15 mg via INTRAMUSCULAR
  Filled 2022-11-17: qty 1

## 2022-11-17 MED ORDER — LIDOCAINE 5 % EX PTCH
2.0000 | MEDICATED_PATCH | CUTANEOUS | Status: DC
  Administered 2022-11-17: 2 via TRANSDERMAL
  Filled 2022-11-17: qty 2

## 2022-11-17 NOTE — Discharge Instructions (Addendum)
You were seen in the ER today for your headaches.  This is likely related to the muscle tension in your neck and your upper back. Your physical exam and vital signs are very reassuring.  The muscles in your neck and upper back are in what is called spasm, meaning they are inappropriately tightened up.  This can be quite painful.  To help with your pain you may take Tylenol and / or NSAID medication (such as ibuprofen or naproxen) to help with your pain.    You may also utilize topical pain relief such as Biofreeze, IcyHot, or topical lidocaine patches.  I also recommend that you apply heat to the area, such as a hot shower or heating pad, and follow heat application with massage of the muscles that are most tight.  Please return to the emergency department if you develop any numbness/tingling/weakness in your arms or legs, any difficulty urinating, or urinary incontinence chest pain, shortness of breath, abdominal pain, nausea or vomiting that does not stop, or any other new severe symptoms.

## 2022-11-17 NOTE — ED Provider Notes (Signed)
[facility]-EMERGENCY DEPT Provider Note   CSN: [id number] Arrival date & time: 11/16/22  2059     History  Chief Complaint  Patient presents with   Headache   Leg Pain    Pamela Moreno is a 46 y.o. female who presents with multiple ongoing complaints for which she has been seen by her primary care doctor in the past.  She states her primary concern is that she has had intermittent headaches ongoing for the last few months.  They come and go states that she will occasionally take 1 dose of Tylenol which does not relieve her headache.  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  Additionally endorses sometimes she has aching in her legs and has been concern for episodes of elevation in her blood pressures.  No history of hypotension or medications she takes daily.  Does follow with her primary care doctor Dr. Cliffton Asters.  She describes her headaches as pain that radiates up from the back of the neck and wraps around the head. Self-resolving. Worse in the evenings after work.   I personally read this patient's medical records.  In addition to above listed history she is history of dyslipidemia, anxiety, paresthesias, atypical chest pain for which she has been evaluated by cardiology in the past and vitamin D deficiency.  Also with history of GERD.  HPI     Home Medications Prior to Admission medications   Medication Sig Start Date End Date Taking? Authorizing Provider  pantoprazole (PROTONIX) 40 MG tablet Take 40 mg by mouth daily. 01/23/20  Yes [provider]  albuterol (VENTOLIN HFA) 108 (90 Base) MCG/ACT inhaler Inhale 1-2 puffs into the lungs every 6 (six) hours as needed for wheezing or shortness of breath. Patient not taking: Reported on 11/17/2022 11/30/21   Gustavus Bryant, FNP  benzonatate (TESSALON) 100 MG capsule Take 1 capsule (100 mg total) by mouth every 8 (eight) hours as needed for cough. Patient not  taking: Reported on 11/17/2022 11/30/21   Gustavus Bryant, FNP  esomeprazole (NEXIUM) 40 MG capsule Take 1 capsule (40 mg total) by mouth daily. 04/04/18 02/17/20  Belinda Fisher, PA-C  omeprazole (PRILOSEC) 10 MG capsule Take by mouth.  02/17/20  [provider]      Allergies    Penicillins and Sulfa antibiotics    Review of Systems   Review of Systems  Constitutional: Negative.   HENT: Negative.    Eyes:  Negative for photophobia, redness and visual disturbance.  Respiratory: Negative.    Cardiovascular: Negative.   Gastrointestinal: Negative.   Genitourinary: Negative.   Musculoskeletal: Negative.   Neurological:  Positive for light-headedness and headaches. Negative for dizziness, tremors, seizures, syncope, facial asymmetry, speech difficulty, weakness and numbness.    Physical Exam Updated Vital Signs BP (!) 144/78 (BP Location: Left Arm)   Pulse 62   Temp 98 F (36.7 C) (Oral)   Resp 19   Ht 5\' 2"  (1.575 m)   Wt 86.2 kg   SpO2 98%   BMI 34.75 kg/m  Physical Exam Vitals and nursing note reviewed.  Constitutional:      Appearance: She is obese. She is not ill-appearing or toxic-appearing.  HENT:     Head: Normocephalic and atraumatic.     Nose: Nose normal.     Mouth/Throat:     Mouth: Mucous membranes are moist.     Pharynx: Oropharynx is clear. Uvula midline.  No oropharyngeal exudate or posterior oropharyngeal erythema.  Eyes:     General: Lids are normal. Vision grossly intact.        Right eye: No discharge.        Left eye: No discharge.     Extraocular Movements: Extraocular movements intact.     Conjunctiva/sclera: Conjunctivae normal.     Pupils: Pupils are equal, round, and reactive to light.  Neck:     Trachea: Trachea and phonation normal.     Meningeal: Brudzinski's sign and Kernig's sign absent.  Cardiovascular:     Rate and Rhythm: Normal rate and regular rhythm.     Pulses: Normal pulses.     Heart sounds: Normal heart sounds. No murmur  heard. Pulmonary:     Effort: Pulmonary effort is normal. No tachypnea, accessory muscle usage, prolonged expiration or respiratory distress.     Breath sounds: Normal breath sounds. No wheezing or rales.  Chest:     Chest wall: No mass, lacerations, deformity, swelling, tenderness or crepitus.  Abdominal:     General: Bowel sounds are normal. There is no distension.     Tenderness: There is no abdominal tenderness.  Musculoskeletal:        General: No deformity.     Right shoulder: No tenderness.     Left shoulder: No tenderness.     Cervical back: Normal range of motion and neck supple. Spasms and tenderness present. No rigidity or bony tenderness. Normal range of motion.     Thoracic back: Spasms and tenderness present. No bony tenderness.     Lumbar back: Normal.     Right lower leg: No edema.     Left lower leg: No edema.     Comments: Spasm and tenderness palpation of the cervical paraspinous musculature bilaterally as well as bilateral trapezius without midline tenderness palpation of the CT, T, or L-spine.  Lymphadenopathy:     Cervical: No cervical adenopathy.  Skin:    General: Skin is warm and dry.     Capillary Refill: Capillary refill takes less than 2 seconds.  Neurological:     Mental Status: She is alert. Mental status is at baseline.     GCS: GCS eye subscore is 4. GCS verbal subscore is 5. GCS motor subscore is 6.     Cranial Nerves: Cranial nerves 2-12 are intact.     Sensory: Sensation is intact.     Motor: Motor function is intact. No pronator drift.     Coordination: Coordination is intact. Romberg sign negative. Coordination normal.     Gait: Gait is intact.  Psychiatric:        Mood and Affect: Mood normal.     ED Results / Procedures / Treatments   Labs (all labs ordered are listed, but only abnormal results are displayed) Labs Reviewed  COMPREHENSIVE METABOLIC PANEL - Abnormal; Notable for the following components:      Result Value   Potassium 3.2  (*)    Glucose, Bld 101 (*)    All other components within normal limits  URINALYSIS, ROUTINE W REFLEX MICROSCOPIC - Abnormal; Notable for the following components:   Color, Urine STRAW (*)    Leukocytes,Ua SMALL (*)    Bacteria, UA RARE (*)    All other components within normal limits  CBC WITH DIFFERENTIAL/PLATELET  CBG MONITORING, ED    EKG None  Radiology CT Head Wo Contrast  Result Date: 11/16/2022 CLINICAL DATA:  Headache, neuro deficit elevated BP, headache EXAM: CT  HEAD WITHOUT CONTRAST TECHNIQUE: Contiguous axial images were obtained from the base of the skull through the vertex without intravenous contrast. RADIATION DOSE REDUCTION: This exam was performed according to the departmental dose-optimization program which includes automated exposure control, adjustment of the mA and/or kV according to patient size and/or use of iterative reconstruction technique. COMPARISON:  MRI head 05/26/2020 FINDINGS: Brain: No evidence of large-territorial acute infarction. No parenchymal hemorrhage. No mass lesion. No extra-axial collection. No mass effect or midline shift. No hydrocephalus. Basilar cisterns are patent. Partial empty sella. Vascular: No hyperdense vessel. Skull: No acute fracture or focal lesion. Sinuses/Orbits: Paranasal sinuses and mastoid air cells are clear. The orbits are unremarkable. Other: None. IMPRESSION: No acute intracranial abnormality. Electronically Signed   By: Iven Finn M.D.   On: 11/16/2022 21:36    Procedures Procedures    Medications Ordered in ED Medications  lidocaine (LIDODERM) 5 % 2 patch (has no administration in time range)  ketorolac (TORADOL) 15 MG/ML injection 15 mg (15 mg Intramuscular Given 11/17/22 0201)  potassium chloride SA (KLOR-CON M) CR tablet 20 mEq (20 mEq Oral Given 11/17/22 0201)    ED Course/ Medical Decision Making/ A&P                           Medical Decision Making  46 year old female with intermittent episodic  headaches for the last few months.  Hypertensive on intake with SBP in the 160s, BP normal at this time with pressures 120s over 70s.  Cardiopulmonary seems unremarkable, abdominal exam is benign.  Neurologic exam is nonfocal.  Musculoskeletal exam as above with findings with cervical paraspinous musculature spasm and trapezius spasm bilaterally. No meningismus.   Emergent considerations for headache include subarachnoid hemorrhage, meningitis, temporal arteritis, glaucoma, cerebral ischemia, carotid/vertebral dissection, intracranial tumor, Venous sinus thrombosis, carbon monoxide poisoning, acute or chronic subdural hemorrhage.  Other considerations include: Migraine, Cluster headache, Tension headache, Hypertension, Caffeine / alcohol / drug withdrawal, Pseudotumor cerebri, Arteriovenous malformation, Head injury, Neurocysticercosis, Post-lumbar puncture, Preeclampsia, Cervical arthritis, Refractive error causing strain, Dental abscess, Sinusitis, Otitis media, Temporomandibular joint syndrome, Depression, Somatoform disorder (eg, somatization) Trigeminal neuralgia, Glossopharyngeal neuralgia.   Amount and/or Complexity of Data Reviewed Labs:     Details: CBC without leukocytosis or anemia, CMP with mild hypokalemia of 3.2.  UA with small leukocytes, not convincing for infection.   Radiology:     Details: CT of the head visualized this provider negative for acute cranial abnormality, agree with radiology interpretation.   ECG/medicine tests:     Details: EKG with normal sinus rhythm without STEMI or interval changes.     Risk Prescription drug management.   Clinical picture most consistent with tension type headaches likely related to patient's persistent downward looking posture and her job as a Scientist, forensic.  Management of muscle spasm discussed.  Toradol and Lidoderm patches offered in the emergency department.  Clinical concern for emergent underlying etiology would warrant further ED  workup or inpatient management is exceedingly low.  No indication for admission at this time.  Aune  voiced understanding of her medical evaluation and treatment plan. Each of their questions answered to their expressed satisfaction.  Return precautions were given.  Patient is well-appearing, stable, and was discharged in good condition.  This chart was dictated using voice recognition software, Dragon. Despite the best efforts of this provider to proofread and correct errors, errors may still occur which can change documentation meaning.  Final Clinical Impression(s) / ED  Diagnoses Final diagnoses:  Episodic tension-type headache, not intractable    Rx / DC Orders ED Discharge Orders     None         Aura Dials 11/17/22 0207    Orpah Greek, MD 11/17/22 [phone]

## 2022-12-28 ENCOUNTER — Encounter: Payer: Self-pay | Admitting: Emergency Medicine

## 2022-12-28 ENCOUNTER — Other Ambulatory Visit: Payer: Self-pay

## 2022-12-28 ENCOUNTER — Ambulatory Visit
Admission: EM | Admit: 2022-12-28 | Discharge: 2022-12-28 | Disposition: A | Discharge status: Home / Self Care | Payer: Commercial Managed Care - HMO | Attending: Emergency Medicine | Admitting: Emergency Medicine

## 2022-12-28 DIAGNOSIS — J329 Chronic sinusitis, unspecified: Secondary | ICD-10-CM

## 2022-12-28 DIAGNOSIS — J4521 Mild intermittent asthma with (acute) exacerbation: Secondary | ICD-10-CM

## 2022-12-28 DIAGNOSIS — J302 Other seasonal allergic rhinitis: Secondary | ICD-10-CM

## 2022-12-28 DIAGNOSIS — R062 Wheezing: Secondary | ICD-10-CM | POA: Diagnosis not present

## 2022-12-28 DIAGNOSIS — H66001 Acute suppurative otitis media without spontaneous rupture of ear drum, right ear: Secondary | ICD-10-CM | POA: Diagnosis not present

## 2022-12-28 DIAGNOSIS — J40 Bronchitis, not specified as acute or chronic: Secondary | ICD-10-CM

## 2022-12-28 MED ORDER — FEXOFENADINE HCL 180 MG PO TABS: 180.0000 mg | ORAL_TABLET | Freq: Every day | ORAL | 1 refills | Status: DC

## 2022-12-28 MED ORDER — GUAIFENESIN 400 MG PO TABS: ORAL_TABLET | ORAL | 0 refills | Status: DC

## 2022-12-28 MED ORDER — MOMETASONE FUROATE 50 MCG/ACT NA SUSP: 2.0000 | Freq: Every day | NASAL | 1 refills | Status: DC

## 2022-12-28 MED ORDER — ALBUTEROL SULFATE HFA 108 (90 BASE) MCG/ACT IN AERS: 2.0000 | INHALATION_SPRAY | Freq: Four times a day (QID) | RESPIRATORY_TRACT | 1 refills | Status: DC | PRN

## 2022-12-28 MED ORDER — IBUPROFEN 400 MG PO TABS: 400.0000 mg | ORAL_TABLET | Freq: Three times a day (TID) | ORAL | 0 refills | Status: DC | PRN

## 2022-12-28 MED ORDER — CEFDINIR 300 MG PO CAPS
300.0000 mg | ORAL_CAPSULE | Freq: Two times a day (BID) | ORAL | 0 refills | Status: AC
Start: 2022-12-28 — End: 2023-01-07

## 2022-12-28 MED ORDER — PROMETHAZINE-DM 6.25-15 MG/5ML PO SYRP: 5.0000 mL | ORAL_SOLUTION | Freq: Every evening | ORAL | 0 refills | Status: DC | PRN

## 2022-12-28 NOTE — ED Provider Notes (Signed)
[facility]    CSN: [id number] Arrival date & time: 12/28/22  1632    HISTORY   Chief Complaint  Patient presents with   URI   HPI Pamela Moreno is a pleasant, 47 y.o. female who presents to urgent care today. Pt c/o a 4-day history of nasal congestion, runny nose, chills, body aches, nonproductive cough and chest congestion.  Patient reports a negative home COVID-19 test this morning.  Patient has normal vital signs on arrival today.  Patient denies headache, loss of taste or smell, sore throat, nausea, vomiting, diarrhea, known sick contacts.  The history is provided by the patient.   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, did not complete full tx   New onset headache    Paresthesias    Vitamin D deficiency    Patient Active Problem List   Diagnosis Date Noted   Chest pain of uncertain etiology 05/10/2020   Educated about COVID-19 virus infection 05/10/2020   Past Surgical History:  Procedure Laterality Date   LASIK  2005   tummy tuck  2018   OB History   No obstetric history on file.    Home Medications    Prior to Admission medications   Medication Sig Start Date End Date Taking? Authorizing Provider  pantoprazole (PROTONIX) 40 MG tablet Take 40 mg by mouth daily. 01/23/20   [provider]  esomeprazole (NEXIUM) 40 MG capsule Take 1 capsule (40 mg total) by mouth daily. 04/04/18 02/17/20  Belinda Fisher, PA-C  omeprazole (PRILOSEC) 10 MG capsule Take by mouth.  02/17/20  [provider]    Family History Family History  Problem Relation Age of Onset   Cancer Mother    Hypertension Father    Hyperlipidemia Father    Social History Social History   Tobacco Use   Smoking status: Never   Smokeless tobacco: Never  Substance Use Topics   Alcohol use: Never   Drug use: Never    Allergies   Penicillins and Sulfa antibiotics  Review of Systems Review of Systems Pertinent findings revealed after performing a 14 point review of systems has been noted in the history of present illness.  Physical Exam Triage Vital Signs ED Triage Vitals  Enc Vitals Group     BP 10/16/21 0827 (!) 147/82     Pulse Rate 10/16/21 0827 72     Resp 10/16/21 0827 18     Temp 10/16/21 0827 98.3 F (36.8 C)     Temp Source 10/16/21 0827 Oral     SpO2 10/16/21 0827 98 %     Weight --      Height --      Head Circumference --      Peak Flow --      Pain Score 10/16/21 0826 5     Pain Loc --      Pain Edu? --      Excl. in GC? --   No data found.  Updated Vital Signs BP 134/78 (BP Location: Right Arm)   Pulse 61   Temp 98.3 F (36.8 C) (Oral)   Resp 18   Ht 5\' 2"  (1.575 m)   Wt 193 lb (87.5 kg)   LMP 03/19/2020   SpO2 96%  BMI 35.30 kg/m   Physical Exam Vitals and nursing note reviewed.  Constitutional:      General: She is not in acute distress.    Appearance: Normal appearance. She is not ill-appearing.  HENT:     Head: Normocephalic and atraumatic.     Salivary Glands: Right salivary gland is not diffusely enlarged or tender. Left salivary gland is not diffusely enlarged or tender.     Right Ear: Ear canal and external ear normal. No drainage. A middle ear effusion is present. There is no impacted cerumen. Tympanic membrane is injected, erythematous and retracted. Tympanic membrane is not bulging.     Left Ear: Ear canal and external ear normal. No drainage.  No middle ear effusion. There is no impacted cerumen. Tympanic membrane is bulging. Tympanic membrane is not injected, erythematous or retracted.     Ears:     Comments: Bilateral EACs normal    Nose: Rhinorrhea present. No nasal deformity, septal deviation, signs of injury, nasal tenderness, mucosal edema or congestion. Rhinorrhea is clear.     Right Nostril: Occlusion present. No foreign body, epistaxis or  septal hematoma.     Left Nostril: Occlusion present. No foreign body, epistaxis or septal hematoma.     Right Turbinates: Enlarged, swollen and pale.     Left Turbinates: Enlarged, swollen and pale.     Right Sinus: No maxillary sinus tenderness or frontal sinus tenderness.     Left Sinus: No maxillary sinus tenderness or frontal sinus tenderness.     Mouth/Throat:     Lips: Pink. No lesions.     Mouth: Mucous membranes are moist. No oral lesions.     Pharynx: Oropharynx is clear. Uvula midline. No posterior oropharyngeal erythema or uvula swelling.     Tonsils: No tonsillar exudate. 0 on the right. 0 on the left.     Comments: Postnasal drip Eyes:     General: Lids are normal.        Right eye: No discharge.        Left eye: No discharge.     Extraocular Movements: Extraocular movements intact.     Conjunctiva/sclera: Conjunctivae normal.     Right eye: Right conjunctiva is not injected.     Left eye: Left conjunctiva is not injected.  Neck:     Trachea: Trachea and phonation normal.  Cardiovascular:     Rate and Rhythm: Normal rate and regular rhythm.     Pulses: Normal pulses.     Heart sounds: Normal heart sounds. No murmur heard.    No friction rub. No gallop.  Pulmonary:     Effort: Pulmonary effort is normal. No accessory muscle usage, prolonged expiration or respiratory distress.     Breath sounds: No stridor, decreased air movement or transmitted upper airway sounds. Examination of the right-upper field reveals decreased breath sounds and wheezing. Examination of the left-upper field reveals decreased breath sounds. Examination of the right-middle field reveals decreased breath sounds. Examination of the left-middle field reveals decreased breath sounds. Examination of the right-lower field reveals decreased breath sounds. Decreased breath sounds and wheezing present. No rhonchi or rales.  Chest:     Chest wall: No tenderness.  Musculoskeletal:        General: Normal range  of motion.     Cervical back: Normal range of motion and neck supple. Normal range of motion.  Lymphadenopathy:     Cervical: No cervical adenopathy.  Skin:    General: Skin is warm and dry.  Findings: No erythema or rash.  Neurological:     General: No focal deficit present.     Mental Status: She is alert and oriented to person, place, and time.  Psychiatric:        Mood and Affect: Mood normal.        Behavior: Behavior normal.     Visual Acuity Right Eye Distance:   Left Eye Distance:   Bilateral Distance:    Right Eye Near:   Left Eye Near:    Bilateral Near:     UC Couse / Diagnostics / Procedures:     Radiology No results found.  Procedures Procedures (including critical care time) EKG  Pending results:  Labs Reviewed - No data to display  Medications Ordered in UC: Medications - No data to display  UC Diagnoses / Final Clinical Impressions(s)   I have reviewed the triage vital signs and the nursing notes.  Pertinent labs & imaging results that were available during my care of the patient were reviewed by me and considered in my medical decision making (see chart for details).    Final diagnoses:  Acute suppurative otitis media of right ear without spontaneous rupture of tympanic membrane, recurrence not specified  Seasonal allergic rhinitis, unspecified trigger  Sinobronchitis  Wheezing on expiration  Mild intermittent reactive airway disease with acute exacerbation   Patient provided with renewal of allergy and asthma medications.  Patient provided with cefdinir for empiric treatment of presumed bacterial otitis media in her right ear.  Patient provided with supportive medications.  Conservative care recommended.  Note provided to be out of work.  Return precautions advised. Please see discharge instructions below for further details of plan of care as provided to patient. ED Prescriptions     Medication Sig Dispense Auth. Provider   mometasone  (NASONEX) 50 MCG/ACT nasal spray Place 2 sprays into the nose daily. 3 each Theadora Rama Scales, PA-C   fexofenadine (ALLEGRA) 180 MG tablet Take 1 tablet (180 mg total) by mouth daily. 90 tablet Theadora Rama Scales, PA-C   cefdinir (OMNICEF) 300 MG capsule Take 1 capsule (300 mg total) by mouth 2 (two) times daily for 10 days. 20 capsule Theadora Rama Scales, PA-C   albuterol (VENTOLIN HFA) 108 (90 Base) MCG/ACT inhaler Inhale 2 puffs into the lungs every 6 (six) hours as needed for wheezing or shortness of breath (Cough). 54 g Theadora Rama Scales, PA-C   guaifenesin (HUMIBID E) 400 MG TABS tablet Take 1 tablet 3 times daily as needed for chest congestion and cough 21 tablet Theadora Rama Scales, PA-C   promethazine-dextromethorphan (PROMETHAZINE-DM) 6.25-15 MG/5ML syrup Take 5 mLs by mouth at bedtime as needed for cough. 60 mL Theadora Rama Scales, PA-C   ibuprofen (ADVIL) 400 MG tablet Take 1 tablet (400 mg total) by mouth every 8 (eight) hours as needed for up to 30 doses. 30 tablet Theadora Rama Scales, PA-C      PDMP not reviewed this encounter.  Disposition Upon Discharge:  Condition: stable for discharge home Home: take medications as prescribed; routine discharge instructions as discussed; follow up as advised.  Patient presented with an acute illness with associated systemic symptoms and significant discomfort requiring urgent management. In my opinion, this is a condition that a prudent lay person (someone who possesses an average knowledge of health and medicine) may potentially expect to result in complications if not addressed urgently such as respiratory distress, impairment of bodily function or dysfunction of bodily organs.   Routine symptom  specific, illness specific and/or disease specific instructions were discussed with the patient and/or caregiver at length.   As such, the patient has been evaluated and assessed, work-up was performed and treatment was  provided in alignment with urgent care protocols and evidence based medicine.  Patient/parent/caregiver has been advised that the patient may require follow up for further testing and treatment if the symptoms continue in spite of treatment, as clinically indicated and appropriate.  If the patient was tested for COVID-19, Influenza and/or RSV, then the patient/parent/guardian was advised to isolate at home pending the results of his/her diagnostic coronavirus test and potentially longer if they're positive. I have also advised pt that if his/her COVID-19 test returns positive, it's recommended to self-isolate for at least 10 days after symptoms first appeared AND until fever-free for 24 hours without fever reducer AND other symptoms have improved or resolved. Discussed self-isolation recommendations as well as instructions for household member/close contacts as per the [facility] and [place] DHHS, and also gave patient the COVID packet with this information.  Patient/parent/caregiver has been advised to return to the [facility] or PCP in 3-5 days if no better; to PCP or the Emergency Department if new signs and symptoms develop, or if the current signs or symptoms continue to change or worsen for further workup, evaluation and treatment as clinically indicated and appropriate  The patient will follow up with their current PCP if and as advised. If the patient does not currently have a PCP we will assist them in obtaining one.   The patient may need specialty follow up if the symptoms continue, in spite of conservative treatment and management, for further workup, evaluation, consultation and treatment as clinically indicated and appropriate.  Patient/parent/caregiver verbalized understanding and agreement of plan as discussed.  All questions were addressed during visit.  Please see discharge instructions below for further details of plan.  Discharge Instructions:   Discharge Instructions      Based on the history  that you provided and my physical exam findings, it appears that your allergies and mild intermittent asthma had flared up causing infection in your right ear and possible bronchitis as well.  Please read below to learn more about the medications, dosages and frequencies that I recommend to help alleviate your symptoms and to get you feeling better soon:   Omnicef (cefdinir):  1 capsule twice daily for 10 days, you can take it with or without food.  This antibiotic can cause upset stomach, this will resolve once antibiotics are complete.  You are welcome to use a probiotic, eat yogurt, take Imodium while taking this medication.  Please avoid other systemic medications such as Maalox, Pepto-Bismol or milk of magnesia as they can interfere with your body's ability to absorb the antibiotics.       Allegra (fexofenadine): This is an excellent second-generation antihistamine that helps to reduce respiratory inflammatory response to environmental allergens.  This medication is not known to cause daytime sleepiness so it can be taken in the daytime.  If you find that it does make you sleepy, please feel free to take it at bedtime.   Nasonex (mometasone): This is a steroid nasal spray that you use once daily, 2 sprays in each nare.  This medication does not work well if you decide to use it only used as you feel you need to, it works best used on a daily basis.  After 3 to 5 days of use, you will notice significant reduction of the inflammation and mucus  production that is currently being caused by exposure to allergens, whether seasonal or environmental.  The most common side effect of this medication is nosebleeds.  If you experience a nosebleed, please discontinue use for 1 week, then feel free to resume.  I have provided you with a prescription.     ProAir, Ventolin, Proventil (albuterol): This inhaled medication contains a short acting beta agonist bronchodilator.  This medication works on the smooth muscle  that opens and constricts of your airways by relaxing the muscle.  The result of relaxation of the smooth muscle is increased air movement and improved work of breathing.  This is a short acting medication that can be used every 4-6 hours as needed for increased work of breathing, shortness of breath, wheezing and excessive coughing.  I have provided you with a prescription.    Advil, Motrin (ibuprofen): This is a good anti-inflammatory medication which not only addresses aches, pains but also significantly reduces soft tissue inflammation of the upper airways that causes sinus and nasal congestion as well as inflammation of the lower airways which makes you feel like your breathing is constricted or your cough feel tight.  I recommend that you take 400 mg every 8 hours as needed.       Robitussin, Mucinex (guaifenesin): This is an expectorant.  This helps break up chest congestion and loosen up thick nasal drainage making phlegm and drainage more liquid and therefore easier to remove.  I recommend being 400 mg three times daily as needed.      Promethazine DM: Promethazine is both a nasal decongestant and an antinausea medication that makes most patients feel fairly sleepy.  The DM is dextromethorphan, a cough suppressant found in many over-the-counter cough medications.  Please take 5 mL before bedtime to minimize your cough which will help you sleep better.  I have sent a prescription for this medication to your pharmacy.   Please follow-up within the next 7-10 days either with your primary care provider or urgent care if your symptoms do not resolve.  If you do not have a primary care provider, we will assist you in finding one.        Thank you for visiting urgent care today.  We appreciate the opportunity to participate in your care.       This office note has been dictated using Teaching laboratory technician.  Unfortunately, this method of dictation can sometimes lead to typographical or  grammatical errors.  I apologize for your inconvenience in advance if this occurs.  Please do not hesitate to reach out to me if clarification is needed.      Theadora Rama Scales, PA-C 12/28/22 1801

## 2022-12-28 NOTE — Discharge Instructions (Signed)
Based on the history that you provided and my physical exam findings, it appears that your allergies and mild intermittent asthma had flared up causing infection in your right ear and possible bronchitis as well.  Please read below to learn more about the medications, dosages and frequencies that I recommend to help alleviate your symptoms and to get you feeling better soon:   Omnicef (cefdinir):  1 capsule twice daily for 10 days, you can take it with or without food.  This antibiotic can cause upset stomach, this will resolve once antibiotics are complete.  You are welcome to use a probiotic, eat yogurt, take Imodium while taking this medication.  Please avoid other systemic medications such as Maalox, Pepto-Bismol or milk of magnesia as they can interfere with your body's ability to absorb the antibiotics.       Allegra (fexofenadine): This is an excellent second-generation antihistamine that helps to reduce respiratory inflammatory response to environmental allergens.  This medication is not known to cause daytime sleepiness so it can be taken in the daytime.  If you find that it does make you sleepy, please feel free to take it at bedtime.   Nasonex (mometasone): This is a steroid nasal spray that you use once daily, 2 sprays in each nare.  This medication does not work well if you decide to use it only used as you feel you need to, it works best used on a daily basis.  After 3 to 5 days of use, you will notice significant reduction of the inflammation and mucus production that is currently being caused by exposure to allergens, whether seasonal or environmental.  The most common side effect of this medication is nosebleeds.  If you experience a nosebleed, please discontinue use for 1 week, then feel free to resume.  I have provided you with a prescription.     ProAir, Ventolin, Proventil (albuterol): This inhaled medication contains a short acting beta agonist bronchodilator.  This medication works  on the smooth muscle that opens and constricts of your airways by relaxing the muscle.  The result of relaxation of the smooth muscle is increased air movement and improved work of breathing.  This is a short acting medication that can be used every 4-6 hours as needed for increased work of breathing, shortness of breath, wheezing and excessive coughing.  I have provided you with a prescription.    Advil, Motrin (ibuprofen): This is a good anti-inflammatory medication which not only addresses aches, pains but also significantly reduces soft tissue inflammation of the upper airways that causes sinus and nasal congestion as well as inflammation of the lower airways which makes you feel like your breathing is constricted or your cough feel tight.  I recommend that you take 400 mg every 8 hours as needed.       Robitussin, Mucinex (guaifenesin): This is an expectorant.  This helps break up chest congestion and loosen up thick nasal drainage making phlegm and drainage more liquid and therefore easier to remove.  I recommend being 400 mg three times daily as needed.      Promethazine DM: Promethazine is both a nasal decongestant and an antinausea medication that makes most patients feel fairly sleepy.  The DM is dextromethorphan, a cough suppressant found in many over-the-counter cough medications.  Please take 5 mL before bedtime to minimize your cough which will help you sleep better.  I have sent a prescription for this medication to your pharmacy.   Please follow-up within the next  7-10 days either with your primary care provider or urgent care if your symptoms do not resolve.  If you do not have a primary care provider, we will assist you in finding one.        Thank you for visiting urgent care today.  We appreciate the opportunity to participate in your care.

## 2022-12-28 NOTE — ED Triage Notes (Signed)
Pt c/o cold like symptoms since last Friday with chills, body aches, cough and chest congestion. Pt states she had a negative Covid test.

## 2023-01-18 DIAGNOSIS — Z0000 Encounter for general adult medical examination without abnormal findings: Secondary | ICD-10-CM | POA: Diagnosis not present

## 2023-01-18 DIAGNOSIS — Z13220 Encounter for screening for lipoid disorders: Secondary | ICD-10-CM | POA: Diagnosis not present

## 2023-01-31 DIAGNOSIS — L900 Lichen sclerosus et atrophicus: Secondary | ICD-10-CM | POA: Diagnosis not present

## 2023-01-31 DIAGNOSIS — Z1231 Encounter for screening mammogram for malignant neoplasm of breast: Secondary | ICD-10-CM | POA: Diagnosis not present

## 2023-01-31 DIAGNOSIS — Z01419 Encounter for gynecological examination (general) (routine) without abnormal findings: Secondary | ICD-10-CM | POA: Diagnosis not present

## 2023-01-31 DIAGNOSIS — N760 Acute vaginitis: Secondary | ICD-10-CM | POA: Diagnosis not present

## 2023-01-31 DIAGNOSIS — Z6834 Body mass index (BMI) 34.0-34.9, adult: Secondary | ICD-10-CM | POA: Diagnosis not present

## 2023-02-14 DIAGNOSIS — G5602 Carpal tunnel syndrome, left upper limb: Secondary | ICD-10-CM | POA: Diagnosis not present

## 2023-02-14 DIAGNOSIS — Z8619 Personal history of other infectious and parasitic diseases: Secondary | ICD-10-CM | POA: Diagnosis not present

## 2023-02-14 DIAGNOSIS — R197 Diarrhea, unspecified: Secondary | ICD-10-CM | POA: Diagnosis not present

## 2023-02-14 DIAGNOSIS — L900 Lichen sclerosus et atrophicus: Secondary | ICD-10-CM | POA: Diagnosis not present

## 2023-03-03 DIAGNOSIS — R202 Paresthesia of skin: Secondary | ICD-10-CM | POA: Diagnosis not present

## 2023-03-03 DIAGNOSIS — E785 Hyperlipidemia, unspecified: Secondary | ICD-10-CM | POA: Diagnosis not present

## 2023-03-03 DIAGNOSIS — Z79899 Other long term (current) drug therapy: Secondary | ICD-10-CM | POA: Diagnosis not present

## 2023-03-14 DIAGNOSIS — M359 Systemic involvement of connective tissue, unspecified: Secondary | ICD-10-CM | POA: Diagnosis not present

## 2023-04-18 DIAGNOSIS — L900 Lichen sclerosus et atrophicus: Secondary | ICD-10-CM | POA: Diagnosis not present

## 2023-05-09 ENCOUNTER — Encounter: Payer: Self-pay | Admitting: Diagnostic Neuroimaging

## 2023-05-09 ENCOUNTER — Telehealth: Payer: Self-pay | Admitting: Diagnostic Neuroimaging

## 2023-05-09 ENCOUNTER — Ambulatory Visit (INDEPENDENT_AMBULATORY_CARE_PROVIDER_SITE_OTHER): Payer: 59 | Admitting: Diagnostic Neuroimaging

## 2023-05-09 VITALS — BP 132/89 | HR 57 | Ht 62.0 in | Wt 190.0 lb

## 2023-05-09 DIAGNOSIS — R200 Anesthesia of skin: Secondary | ICD-10-CM | POA: Diagnosis not present

## 2023-05-09 DIAGNOSIS — R202 Paresthesia of skin: Secondary | ICD-10-CM

## 2023-05-09 DIAGNOSIS — R519 Headache, unspecified: Secondary | ICD-10-CM

## 2023-05-09 NOTE — Progress Notes (Signed)
[facility]  PATIENT: Pamela Moreno DOB: 07/06/1976  REFERRING CLINICIAN: Tally Joe, MD HISTORY FROM: patient  REASON FOR VISIT: new consult   HISTORICAL  CHIEF COMPLAINT:  Chief Complaint  Patient presents with   New Patient (Initial Visit)    Pt alone, rm 7. Here today referred by PCP for concerns of pain that starts in chest and spreads into arms and head. States the tingling sensation is more on left side. Cardiac work up c/o and negative. Intermittent occurs and it when it occurs can happen back to back at once. She sometimes wakes up in the middle of the night. She states that she feels like she is going to pass out afterwards. This is not associated with position changes but does occur after having the pain and numbness/tingling sens.    HISTORY OF PRESENT ILLNESS:   47 year old female here for evaluation of left arm and hand numbness.  Symptoms have been present for several years.  Patient referred here for evaluation of carpal tunnel syndrome.  Also since 2020 patient has had intermittent episodes of chest pain, burning pain in the arms, head ache and neck pain.  She has had about 10 episodes total so far.  Sometimes she feels lightheaded and dizzy.  She has passed out with 1 of these events in the past.  Also having whooshing sound in her head.  Also having issues of general malaise fatigue and tiredness.    REVIEW OF SYSTEMS: Full 14 system review of systems performed and negative with exception of: as per HPI.  ALLERGIES: Allergies  Allergen Reactions   Penicillins Itching    Amoxicillin, cleocin, augmentin-rash   Sulfa Antibiotics Itching    HOME MEDICATIONS: Outpatient Medications Prior to Visit  Medication Sig Dispense Refill   atorvastatin (LIPITOR) 20 MG tablet Take 20 mg by mouth daily.     famotidine (PEPCID) 40 MG tablet Take 40 mg by mouth at bedtime as needed.     albuterol (VENTOLIN HFA) 108 (90 Base) MCG/ACT inhaler Inhale 2  puffs into the lungs every 6 (six) hours as needed for wheezing or shortness of breath (Cough). 54 g 1   fexofenadine (ALLEGRA) 180 MG tablet Take 1 tablet (180 mg total) by mouth daily. 90 tablet 1   guaifenesin (HUMIBID E) 400 MG TABS tablet Take 1 tablet 3 times daily as needed for chest congestion and cough 21 tablet 0   hydrOXYzine (ATARAX) 25 MG tablet Take 25 mg by mouth every 6 (six) hours as needed.     ibuprofen (ADVIL) 400 MG tablet Take 1 tablet (400 mg total) by mouth every 8 (eight) hours as needed for up to 30 doses. 30 tablet 0   mometasone (NASONEX) 50 MCG/ACT nasal spray Place 2 sprays into the nose daily. 3 each 1   pantoprazole (PROTONIX) 40 MG tablet Take 40 mg by mouth daily.     promethazine-dextromethorphan (PROMETHAZINE-DM) 6.25-15 MG/5ML syrup Take 5 mLs by mouth at bedtime as needed for cough. 60 mL 0   No facility-administered medications prior to visit.    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, did not complete full tx   New onset headache    Paresthesias    Vitamin D deficiency  PAST SURGICAL HISTORY: Past Surgical History:  Procedure Laterality Date   LASIK  2005   tummy tuck  2018    FAMILY HISTORY: Family History  Problem Relation Age of Onset   Cancer Mother    Hypertension Father    Hyperlipidemia Father     SOCIAL HISTORY: Social History   Socioeconomic History   Marital status: Married    Spouse name: Lars Mage   Number of children: 3   Years of education: Not on file   Highest education level: Not on file  Occupational History   Not on file  Tobacco Use   Smoking status: Never   Smokeless tobacco: Never  Substance and Sexual Activity   Alcohol use: Never   Drug use: Never   Sexual activity: Yes    Birth control/protection: Surgical  Other Topics  Concern   Not on file  Social History Narrative   Father lives with her.  Moved from [place] age 19   Lives with spouse   Social Determinants of Health   Financial Resource Strain: Not on file  Food Insecurity: Not on file  Transportation Needs: Not on file  Physical Activity: Not on file  Stress: Not on file  Social Connections: Not on file  Intimate Partner Violence: Not on file     PHYSICAL EXAM  GENERAL EXAM/CONSTITUTIONAL: Vitals:  Vitals:   05/09/23 1047  BP: 132/89  Pulse: (!) 57  Weight: 190 lb (86.2 kg)  Height: 5\' 2"  (1.575 m)   Body mass index is 34.75 kg/m. Wt Readings from Last 3 Encounters:  05/09/23 190 lb (86.2 kg)  12/28/22 193 lb (87.5 kg)  11/16/22 190 lb (86.2 kg)   Patient is in no distress; well developed, nourished and groomed; neck is supple  CARDIOVASCULAR: Examination of carotid arteries is normal; no carotid bruits Regular rate and rhythm, no murmurs Examination of peripheral vascular system by observation and palpation is normal  EYES: Ophthalmoscopic exam of optic discs and posterior segments is normal; no papilledema or hemorrhages No results found.  MUSCULOSKELETAL: Gait, strength, tone, movements noted in Neurologic exam below  NEUROLOGIC: MENTAL STATUS:      No data to display         awake, alert, oriented to person, place and time recent and remote memory intact normal attention and concentration language fluent, comprehension intact, naming intact fund of knowledge appropriate  CRANIAL NERVE:  2nd - no papilledema on fundoscopic exam 2nd, 3rd, 4th, 6th - pupils equal and reactive to light, visual fields full to confrontation, extraocular muscles intact, no nystagmus 5th - facial sensation symmetric 7th - facial strength symmetric 8th - hearing intact 9th - palate elevates symmetrically, uvula midline 11th - shoulder shrug symmetric 12th - tongue protrusion midline  MOTOR:  normal bulk and tone, full  strength in the BUE, BLE  SENSORY:  normal and symmetric to light touch, pinprick, temperature, vibration NEGATIVE TINELS, NEGATIVE PHALENS  COORDINATION:  finger-nose-finger, fine finger movements normal  REFLEXES:  deep tendon reflexes 1+ and symmetric  GAIT/STATION:  narrow based gait     DIAGNOSTIC DATA (LABS, IMAGING, TESTING) - I reviewed patient records, labs, notes, testing and imaging myself where available.  Lab Results  Component Value Date   WBC 8.3 11/16/2022   HGB 13.0 11/16/2022   HCT 41.2 11/16/2022   MCV 86.2 11/16/2022   PLT 198 11/16/2022      Component Value Date/Time   NA 137 11/16/2022 2218   K 3.2 (L) 11/16/2022 2218  CL 107 11/16/2022 2218   CO2 24 11/16/2022 2218   GLUCOSE 101 (H) 11/16/2022 2218   BUN 16 11/16/2022 2218   CREATININE 0.54 11/16/2022 2218   CALCIUM 9.1 11/16/2022 2218   PROT 7.5 11/16/2022 2218   ALBUMIN 3.7 11/16/2022 2218   AST 30 11/16/2022 2218   ALT 32 11/16/2022 2218   ALKPHOS 70 11/16/2022 2218   BILITOT 0.5 11/16/2022 2218   GFRNONAA >60 11/16/2022 2218   GFRAA >60 03/24/2020 1055   No results found for: "CHOL", "HDL", "LDLCALC", "LDLDIRECT", "TRIG", "CHOLHDL" No results found for: "HGBA1C" No results found for: "VITAMINB12" No results found for: "TSH"  05/26/20 MRI brain [I reviewed images myself and agree with interpretation. -VRP]  1. No acute intracranial abnormality. 2. Partial empty sella. This is a nonspecific finding that can be seen in the setting of idiopathic intracranial hypertension for which clinical correlation is necessary.    ASSESSMENT AND PLAN  48 y.o. year old female here with:   Dx:  1. Numbness and tingling in left hand   2. New onset headache      PLAN:  INTERMITTENT HEADACHE, CHEST PAIN, ARM PAIN, LIGHTHEADED, TINNITUS (worsening) - check MRI brain w/wo - follow up with ophthalmology - then may consider LP for opening pressure  LEFT ARM NUMBNESS - check EMG/NCS  (eval for carpal tunnel syndrome)  Orders Placed This Encounter  Procedures   MR BRAIN W WO CONTRAST   NCV with EMG(electromyography)   Return for for NCV/EMG.  I spent 60 minutes of face-to-face and non-face-to-face time with patient.  This included previsit chart review, lab review, study review, order entry, electronic health record documentation, patient education.     Suanne Marker, MD 05/09/2023, 11:28 AM Certified in Neurology, Neurophysiology and Neuroimaging  [facility] [address], [address] [place], [place] [address] [phone]

## 2023-05-09 NOTE — Telephone Encounter (Signed)
sent to GI they obtain [employer] auth. [place] pending case #[id number] anticipated determination date 05/19/23  [id number]

## 2023-05-09 NOTE — Patient Instructions (Addendum)
  INTERMITTENT HEADACHE, CHEST PAIN, ARM PAIN, LIGHTHEADED (worsening) - check MRI brain w/wo - follow up with ophthalmology  LEFT ARM NUMBNESS - check EMG/NCS (eval for carpal tunnel syndrome)

## 2023-05-22 ENCOUNTER — Ambulatory Visit
Admission: EM | Admit: 2023-05-22 | Discharge: 2023-05-22 | Disposition: A | Discharge status: Home / Self Care | Payer: 59

## 2023-05-22 DIAGNOSIS — H60392 Other infective otitis externa, left ear: Secondary | ICD-10-CM

## 2023-05-22 MED ORDER — CIPROFLOXACIN-DEXAMETHASONE 0.3-0.1 % OT SUSP: 4.0000 [drp] | Freq: Two times a day (BID) | OTIC | 0 refills | Status: AC

## 2023-05-22 NOTE — ED Triage Notes (Signed)
Left ear pain. "I used a qtip and since then started hurting worse". No fever. Pain started last night. "I think I have an infection". No nasal congestion or cold symptoms.

## 2023-05-22 NOTE — ED Provider Notes (Signed)
[facility]    CSN: [id number] Arrival date & time: 05/22/23  1027      History   Chief Complaint Chief Complaint  Patient presents with   Ear Problem    HPI Pamela Moreno is a 47 y.o. female.   Patient presents with left ear pain that started last night.  Patient reports that she used a Q-tip to clean out her ear and pain subsequently started after this.  Denies any associated upper respiratory symptoms, cough, fever.  Denies any drainage from the ear.  She has not taken any medications to help alleviate symptoms.     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, did not complete full tx   New onset headache    Paresthesias    Vitamin D deficiency     Patient Active Problem List   Diagnosis Date Noted   Chest pain of uncertain etiology 05/10/2020   Educated about COVID-19 virus infection 05/10/2020    Past Surgical History:  Procedure Laterality Date   LASIK  2005   tummy tuck  2018    OB History   No obstetric history on file.      Home Medications    Prior to Admission medications   Medication Sig Start Date End Date Taking? Authorizing Provider  atorvastatin (LIPITOR) 20 MG tablet Take 20 mg by mouth daily.   Yes [provider]  ciprofloxacin-dexamethasone (CIPRODEX) OTIC suspension Place 4 drops into the left ear 2 (two) times daily for 7 days. 05/22/23 05/29/23 Yes Billiejo Sorto, Acie Fredrickson, FNP  diclofenac Sodium (PENNSAID) 2 % SOLN APPLY 2 PUMPS (40 MG) TO THE AFFECTED AREA BY TOPICAL ROUTE 2 TIMES PER DAY 01/01/19  Yes [provider]  famotidine (PEPCID) 40 MG tablet Take 40 mg by mouth at bedtime as needed.   Yes [provider]  omeprazole (PRILOSEC) 10 MG capsule Take by mouth. 01/19/18  Yes [provider]  predniSONE (DELTASONE) 5 MG tablet 1 tab tid x 2 days, 1  tab bid x 5 days, 1 tab qd till finished 01/01/19  Yes [provider]  benzonatate (TESSALON) 200 MG capsule     [provider]  cetirizine (ZYRTEC) 10 MG tablet Take 1 tablet by mouth daily.    [provider]  clarithromycin (BIAXIN) 500 MG tablet TAKE 1 TABLET BY MOUTH EVERY 12 HOURS FOR 14 DAYS    [provider]  clindamycin (CLEOCIN) 300 MG capsule Take 1 capsule by mouth 3 (three) times daily.    [provider]  clobetasol ointment (TEMOVATE) 0.05 % APPLY A THIN LAYER TO THE AFFECTED AREA(S) BY TOPICAL ROUTE 2 TIMES PER DAY X 2 WEEKS    [provider]  dexamethasone (DECADRON) 6 MG tablet     [provider]  esomeprazole (NEXIUM) 40 MG capsule     [provider]  HYDROcodone bit-homatropine (HYDROMET) 5-1.5 MG/5ML syrup TAKE 5 ML BY MOUTH EVERY 6 HOURS AS NEEDED FOR COUGH    [provider]  meloxicam (MOBIC) 15 MG tablet TK 1 T PO QD    [provider]  metroNIDAZOLE (FLAGYL) 250 MG tablet Take 250 mg by mouth 3 (three) times daily.    [provider]  nitrofurantoin, macrocrystal-monohydrate, (  MACROBID) 100 MG capsule TK 1 C PO BID FOR 7 DAYS    [provider]  pantoprazole (PROTONIX) 40 MG tablet 1 tablet Orally Once a day    [provider]  polyethylene glycol-electrolytes (NULYTELY) 420 g solution MIX AND DRINK AS DIRECTED    [provider]  predniSONE (DELTASONE) 10 MG tablet TK 1 T TID FOR 2. TK 1 T BID FOR 5 DAYS AND TK 1 T QD TILL FINISHED    [provider]  predniSONE (DELTASONE) 20 MG tablet TAKE 1 TABLET BY MOUTH EVERY MORNING FOR 5 DAYS    [provider]  triamcinolone cream (KENALOG) 0.5 % APPLY TOPICALLY TO THE AFFECTED AREA TWICE DAILY    [provider]    Family History Family History  Problem Relation Age of Onset   Cancer Mother    Hypertension Father    Hyperlipidemia Father     Social History Social  History   Tobacco Use   Smoking status: Never   Smokeless tobacco: Never  Vaping Use   Vaping Use: Never used  Substance Use Topics   Alcohol use: Never   Drug use: Never     Allergies   Wasp venom, Penicillins, Sulfa antibiotics, Amoxicillin, Amoxicillin-pot clavulanate, Clindamycin, Clindamycin hcl, and Sulfamethoxazole   Review of Systems Review of Systems Per HPI  Physical Exam Triage Vital Signs ED Triage Vitals  Enc Vitals Group     BP 05/22/23 0836 (!) 147/84     Pulse Rate 05/22/23 0836 64     Resp 05/22/23 0836 18     Temp 05/22/23 0836 (!) 97.5 F (36.4 C)     Temp Source 05/22/23 0836 Oral     SpO2 05/22/23 0836 98 %     Weight 05/22/23 0831 190 lb (86.2 kg)     Height 05/22/23 0831 5\' 2"  (1.575 m)     Head Circumference --      Peak Flow --      Pain Score 05/22/23 0831 3     Pain Loc --      Pain Edu? --      Excl. in GC? --    No data found.  Updated Vital Signs BP 129/80 (BP Location: Left Arm)   Pulse 64   Temp (!) 97.5 F (36.4 C) (Oral)   Resp 18   Ht 5\' 2"  (1.575 m)   Wt 190 lb (86.2 kg)   LMP 03/19/2020   SpO2 98%   BMI 34.75 kg/m   Visual Acuity Right Eye Distance:   Left Eye Distance:   Bilateral Distance:    Right Eye Near:   Left Eye Near:    Bilateral Near:     Physical Exam Constitutional:      General: She is not in acute distress.    Appearance: Normal appearance. She is not toxic-appearing or diaphoretic.  HENT:     Head: Normocephalic and atraumatic.     Left Ear: Tympanic membrane and external ear normal. Swelling and tenderness present.  No middle ear effusion. Tympanic membrane is not perforated, erythematous or bulging.     Ears:     Comments: Patient has mild amount of swelling and erythema present to the left external canal.  TM appears normal and intact. Eyes:     Extraocular Movements: Extraocular movements intact.     Conjunctiva/sclera: Conjunctivae normal.  Pulmonary:     Effort: Pulmonary effort is  normal.  Neurological:     General: No  focal deficit present.     Mental Status: She is alert and oriented to person, place, and time. Mental status is at baseline.  Psychiatric:        Mood and Affect: Mood normal.        Behavior: Behavior normal.        Thought Content: Thought content normal.        Judgment: Judgment normal.      UC Treatments / Results  Labs (all labs ordered are listed, but only abnormal results are displayed) Labs Reviewed - No data to display  EKG   Radiology No results found.  Procedures Procedures (including critical care time)  Medications Ordered in UC Medications - No data to display  Initial Impression / Assessment and Plan / UC Course  I have reviewed the triage vital signs and the nursing notes.  Pertinent labs & imaging results that were available during my care of the patient were reviewed by me and considered in my medical decision making (see chart for details).     Physical exam is consistent with left otitis externa.  TM is intact and normal.  Will treat with Ciprodex antibiotic drops.  Advised patient to follow-up if any symptoms persist or worsen.  Patient verbalized understanding and was agreeable with plan. Final Clinical Impressions(s) / UC Diagnoses   Final diagnoses:  Other infective acute otitis externa of left ear     Discharge Instructions      You have an infection of your ear canal.  I have prescribed an antibiotic eardrop to help alleviate symptoms.  Follow-up if any symptoms persist or worsen.    ED Prescriptions     Medication Sig Dispense Auth. Provider   ciprofloxacin-dexamethasone (CIPRODEX) OTIC suspension Place 4 drops into the left ear 2 (two) times daily for 7 days. 2.8 mL Gustavus Bryant, [place]      PDMP not reviewed this encounter.   Gustavus Bryant, [place] 05/22/23 [phone]

## 2023-05-22 NOTE — Discharge Instructions (Signed)
You have an infection of your ear canal.  I have prescribed an antibiotic eardrop to help alleviate symptoms.  Follow-up if any symptoms persist or worsen.

## 2023-06-09 ENCOUNTER — Encounter: Payer: 59 | Admitting: Diagnostic Neuroimaging

## 2023-06-24 ENCOUNTER — Encounter: Payer: Self-pay | Admitting: Diagnostic Neuroimaging

## 2023-06-27 ENCOUNTER — Ambulatory Visit
Admission: RE | Admit: 2023-06-27 | Discharge: 2023-06-27 | Disposition: A | Discharge status: Home / Self Care | Payer: 59 | Source: Ambulatory Visit | Attending: Diagnostic Neuroimaging | Admitting: Diagnostic Neuroimaging

## 2023-06-27 DIAGNOSIS — R519 Headache, unspecified: Secondary | ICD-10-CM

## 2023-06-27 DIAGNOSIS — R202 Paresthesia of skin: Secondary | ICD-10-CM | POA: Diagnosis not present

## 2023-06-27 DIAGNOSIS — R200 Anesthesia of skin: Secondary | ICD-10-CM

## 2023-06-27 MED ORDER — GADOPICLENOL 0.5 MMOL/ML IV SOLN
9.0000 mL | Freq: Once | INTRAVENOUS | Status: AC | PRN
  Administered 2023-06-27: 9 mL via INTRAVENOUS

## 2023-07-18 DIAGNOSIS — F419 Anxiety disorder, unspecified: Secondary | ICD-10-CM | POA: Diagnosis not present

## 2023-07-18 DIAGNOSIS — E669 Obesity, unspecified: Secondary | ICD-10-CM | POA: Diagnosis not present

## 2023-07-18 DIAGNOSIS — R0789 Other chest pain: Secondary | ICD-10-CM | POA: Diagnosis not present

## 2023-07-18 DIAGNOSIS — R0602 Shortness of breath: Secondary | ICD-10-CM | POA: Diagnosis not present

## 2023-07-18 DIAGNOSIS — R7303 Prediabetes: Secondary | ICD-10-CM | POA: Diagnosis not present

## 2023-07-18 DIAGNOSIS — K219 Gastro-esophageal reflux disease without esophagitis: Secondary | ICD-10-CM | POA: Diagnosis not present

## 2023-08-15 ENCOUNTER — Emergency Department (HOSPITAL_COMMUNITY): Payer: 59

## 2023-08-15 ENCOUNTER — Emergency Department (HOSPITAL_COMMUNITY)
Admission: EM | Admit: 2023-08-15 | Discharge: 2023-08-15 | Disposition: A | Discharge status: Home / Self Care | Payer: 59 | Attending: Emergency Medicine | Admitting: Emergency Medicine

## 2023-08-15 ENCOUNTER — Encounter (HOSPITAL_COMMUNITY): Payer: Self-pay

## 2023-08-15 ENCOUNTER — Other Ambulatory Visit: Payer: Self-pay

## 2023-08-15 DIAGNOSIS — R0989 Other specified symptoms and signs involving the circulatory and respiratory systems: Secondary | ICD-10-CM | POA: Diagnosis not present

## 2023-08-15 DIAGNOSIS — R0600 Dyspnea, unspecified: Secondary | ICD-10-CM | POA: Diagnosis not present

## 2023-08-15 DIAGNOSIS — R0602 Shortness of breath: Secondary | ICD-10-CM | POA: Insufficient documentation

## 2023-08-15 DIAGNOSIS — Z8616 Personal history of COVID-19: Secondary | ICD-10-CM | POA: Diagnosis not present

## 2023-08-15 LAB — CBC WITH DIFFERENTIAL/PLATELET
Abs Immature Granulocytes: 0.03 10*3/uL (ref 0.00–0.07)
Basophils Absolute: 0.1 10*3/uL (ref 0.0–0.1)
Basophils Relative: 1 %
Eosinophils Absolute: 0.1 10*3/uL (ref 0.0–0.5)
Eosinophils Relative: 2 %
HCT: 39.7 % (ref 36.0–46.0)
Hemoglobin: 13.2 g/dL (ref 12.0–15.0)
Immature Granulocytes: 1 %
Lymphocytes Relative: 41 %
Lymphs Abs: 2.6 10*3/uL (ref 0.7–4.0)
MCH: 29.5 pg (ref 26.0–34.0)
MCHC: 33.2 g/dL (ref 30.0–36.0)
MCV: 88.8 fL (ref 80.0–100.0)
Monocytes Absolute: 0.5 10*3/uL (ref 0.1–1.0)
Monocytes Relative: 8 %
Neutro Abs: 3.1 10*3/uL (ref 1.7–7.7)
Neutrophils Relative %: 47 %
Platelets: 166 10*3/uL (ref 150–400)
RBC: 4.47 MIL/uL (ref 3.87–5.11)
RDW: 12.7 % (ref 11.5–15.5)
WBC: 6.4 10*3/uL (ref 4.0–10.5)
nRBC: 0 % (ref 0.0–0.2)

## 2023-08-15 LAB — BASIC METABOLIC PANEL
Anion gap: 10 (ref 5–15)
BUN: 10 mg/dL (ref 6–20)
CO2: 24 mmol/L (ref 22–32)
Calcium: 9.5 mg/dL (ref 8.9–10.3)
Chloride: 107 mmol/L (ref 98–111)
Creatinine, Ser: 0.68 mg/dL (ref 0.44–1.00)
GFR, Estimated: >60 mL/min (ref >60)
Glucose, Bld: 89 mg/dL (ref 70–99)
Potassium: 3.6 mmol/L (ref 3.5–5.1)
Sodium: 141 mmol/L (ref 135–145)

## 2023-08-15 LAB — BRAIN NATRIURETIC PEPTIDE: B Natriuretic Peptide: 72.7 pg/mL (ref 0.0–100.0)

## 2023-08-15 LAB — TROPONIN I (HIGH SENSITIVITY)
Troponin I (High Sensitivity): 3 ng/L (ref <18)
Troponin I (High Sensitivity): 4 ng/L (ref <18)

## 2023-08-15 NOTE — ED Provider Notes (Signed)
[place] EMERGENCY DEPARTMENT AT [facility] Provider Note   CSN: [id number] Arrival date & time: 08/15/23  1034     History  Chief Complaint  Patient presents with   Shortness of Breath    Pamela Moreno is a 47 y.o. female.  This is a 47 year old female here today for shortness of breath.  Patient says that ever since she had COVID in 2019, she has been intermittently short of breath.  She has followed up with her primary care doctor, but says that she has not seen improvement.  She came to the emergency department today because she feels as though it is not getting better.  No recent travel, not on any oral contraceptives.        Home Medications Prior to Admission medications   Medication Sig Start Date End Date Taking? Authorizing Provider  atorvastatin (LIPITOR) 20 MG tablet Take 20 mg by mouth daily.    [provider]  benzonatate (TESSALON) 200 MG capsule     [provider]  cetirizine (ZYRTEC) 10 MG tablet Take 1 tablet by mouth daily.    [provider]  clarithromycin (BIAXIN) 500 MG tablet TAKE 1 TABLET BY MOUTH EVERY 12 HOURS FOR 14 DAYS    [provider]  clindamycin (CLEOCIN) 300 MG capsule Take 1 capsule by mouth 3 (three) times daily.    [provider]  clobetasol ointment (TEMOVATE) 0.05 % APPLY A THIN LAYER TO THE AFFECTED AREA(S) BY TOPICAL ROUTE 2 TIMES PER DAY X 2 WEEKS    [provider]  dexamethasone (DECADRON) 6 MG tablet     [provider]  diclofenac Sodium (PENNSAID) 2 % SOLN APPLY 2 PUMPS (40 MG) TO THE AFFECTED AREA BY TOPICAL ROUTE 2 TIMES PER DAY 01/01/19   [provider]  esomeprazole (NEXIUM) 40 MG capsule     [provider]  famotidine (PEPCID) 40 MG tablet Take 40 mg by mouth at bedtime as needed.    [provider]  HYDROcodone bit-homatropine (HYDROMET) 5-1.5 MG/5ML syrup TAKE 5 ML BY MOUTH EVERY 6 HOURS AS NEEDED FOR COUGH    [provider]  meloxicam (MOBIC) 15 MG tablet TK 1 T PO QD    [provider]  metroNIDAZOLE (FLAGYL) 250 MG tablet Take 250 mg by mouth 3 (three) times daily.    [provider]  nitrofurantoin, macrocrystal-monohydrate, (MACROBID) 100 MG capsule TK 1 C PO BID FOR 7 DAYS    [provider]  omeprazole (PRILOSEC) 10 MG capsule Take by mouth. 01/19/18   [provider]  pantoprazole (PROTONIX) 40 MG tablet 1 tablet Orally Once a day    [provider]  polyethylene glycol-electrolytes (NULYTELY) 420 g solution MIX AND DRINK AS DIRECTED    [provider]  predniSONE (DELTASONE) 10 MG tablet TK 1 T TID FOR 2. TK 1 T BID FOR 5 DAYS AND TK 1 T QD TILL FINISHED    [provider]  predniSONE (DELTASONE) 20 MG tablet TAKE 1 TABLET BY MOUTH EVERY MORNING FOR 5 DAYS    [provider]  predniSONE (DELTASONE) 5 MG tablet 1 tab tid x 2 days, 1 tab bid x 5 days, 1 tab qd till finished 01/01/19   [provider]  triamcinolone cream (KENALOG) 0.5 % APPLY TOPICALLY TO THE AFFECTED AREA TWICE DAILY    [provider]      Allergies    Wasp venom, Penicillins, Sulfa antibiotics,  Amoxicillin, Amoxicillin-pot clavulanate, Clindamycin, Clindamycin hcl, and Sulfamethoxazole    Review of Systems   Review of Systems  Physical Exam Updated Vital Signs BP (!) 161/86   Pulse 63   Temp 98.3 F (36.8 C) (Oral)   Resp 17   Ht 5\' 2"  (1.575 m)   Wt 86.2 kg   LMP 03/19/2020   SpO2 100%   BMI 34.75 kg/m  Physical Exam Cardiovascular:     Rate and Rhythm: Normal rate and regular rhythm.     Heart sounds: Normal heart sounds.  Pulmonary:     Effort: Pulmonary effort is normal. No respiratory distress.     Breath sounds: Normal breath sounds. No stridor. No wheezing, rhonchi or rales.  Neurological:     General: No focal deficit present.     Mental Status: She is alert.     ED Results / Procedures / Treatments    Labs (all labs ordered are listed, but only abnormal results are displayed) Labs Reviewed  BASIC METABOLIC PANEL  CBC WITH DIFFERENTIAL/PLATELET  BRAIN NATRIURETIC PEPTIDE  TROPONIN I (HIGH SENSITIVITY)  TROPONIN I (HIGH SENSITIVITY)    EKG EKG Interpretation Date/Time:  Monday August 15 2023 11:44:54 EDT Ventricular Rate:  51 PR Interval:  151 QRS Duration:  91 QT Interval:  480 QTC Calculation: 443 R Axis:   71  Text Interpretation: Sinus rhythm Anteroseptal infarct, age indeterminate No acute changes Confirmed by Anders Simmonds [phone]) on 08/15/2023 3:14:34 PM  Radiology DG Chest 2 View  Result Date: 08/15/2023 CLINICAL DATA:  Dyspnea EXAM: CHEST - 2 VIEW COMPARISON:  03/24/2020 FINDINGS: Frontal and lateral views of the chest demonstrate borderline enlargement the cardiac silhouette. There is increased vascular prominence, without focal airspace disease, effusion, or pneumothorax. No acute bony abnormalities. IMPRESSION: 1. Enlarged cardiac silhouette and pulmonary vascular congestion, most consistent with mild congestive heart failure. No overt edema at this time. Electronically Signed   By: Sharlet Salina M.D.   On: 08/15/2023 11:49    Procedures Procedures    Medications Ordered in ED Medications - No data to display  ED Course/ Medical Decision Making/ A&P                                 Medical Decision Making 47 year old female who is here today for shortness of breath of 4 years duration.  Differential diagnoses include chronic lung disease, long COVID, pneumonia, ACS, less likely PE.  Plan-with the patient's duration of symptoms, I have lower suspicion for an acute emergent process.  Will obtain plain films of the patient's chest, EKG, labs ordered.  Patient overall looks well, reassuring vital signs.  Wells score 0.  Reassessment-patient's chest x-ray, per my independent review shows no pneumonia.  There does appear to be some venous vascular congestion.   Will refer the patient to cardiology for outpatient workup for heart failure.  Troponin negative x 2.  Will discharge patient home.  Amount and/or Complexity of Data Reviewed Labs: ordered. Radiology: ordered.           Final Clinical Impression(s) / ED Diagnoses Final diagnoses:  Shortness of breath    Rx / DC Orders ED Discharge Orders     None         Arletha Pili, DO 08/15/23 1521

## 2023-08-15 NOTE — Discharge Instructions (Addendum)
Your workup that was done here today was normal.  The chest x-ray did show some "venous congestion".  Sometimes we can see this when people's heart muscles become a little bit weaker.  We have you follow-up with cardiology so that they can do additional testing.  Please contact the cardiology office this week if you do not hear from them by Friday.

## 2023-08-15 NOTE — ED Triage Notes (Signed)
Pt arrives from home for c/o sob x 3 months. No sob on exertion per pt. VSS. Pt states she informed her primary care but nothing was done. Hx HTN per pt

## 2023-08-29 DIAGNOSIS — Z1211 Encounter for screening for malignant neoplasm of colon: Secondary | ICD-10-CM | POA: Diagnosis not present

## 2023-08-29 DIAGNOSIS — K621 Rectal polyp: Secondary | ICD-10-CM | POA: Diagnosis not present

## 2023-08-29 DIAGNOSIS — K635 Polyp of colon: Secondary | ICD-10-CM | POA: Diagnosis not present

## 2023-09-12 DIAGNOSIS — F419 Anxiety disorder, unspecified: Secondary | ICD-10-CM | POA: Diagnosis not present

## 2023-09-12 DIAGNOSIS — Z23 Encounter for immunization: Secondary | ICD-10-CM | POA: Diagnosis not present

## 2023-09-12 DIAGNOSIS — R0609 Other forms of dyspnea: Secondary | ICD-10-CM | POA: Diagnosis not present

## 2023-09-12 DIAGNOSIS — E669 Obesity, unspecified: Secondary | ICD-10-CM | POA: Diagnosis not present

## 2023-09-12 DIAGNOSIS — K219 Gastro-esophageal reflux disease without esophagitis: Secondary | ICD-10-CM | POA: Diagnosis not present

## 2023-10-23 NOTE — Progress Notes (Deleted)
  Cardiology Office Note:  .   Date:  10/23/2023  ID:  Pamela Moreno, DOB February 16, 1976, MRN [id number] PCP: Laurann Montana, MD  [facility] Providers Cardiologist:  None { Click to update primary MD,subspecialty MD or APP then REFRESH:1}    No chief complaint on file.   Patient Profile: .     Pamela Moreno is a *** 47 y.o. female *** with a PMH notable for *** who presents here for *** at the request of Laurann Montana, MD.  {There is no content from the last Narrative History section.}      Pamela Moreno was last seen on ***  Subjective  Discussed the use of AI scribe software for clinical note transcription with the patient, who gave verbal consent to proceed.  History of Present Illness            Cardiovascular ROS: {roscv:310661}  ROS:  Review of Systems - {ros master:310782}    Objective   Studies Reviewed: Marland Kitchen        ECHO: *** CATH: *** MONITOR: *** CT: ***  Risk Assessment/Calculations:   {Does this patient have ATRIAL FIBRILLATION?:[id number]} No BP recorded.  {Refresh Note OR Click here to enter BP  :1}***         Physical Exam:   VS:  LMP 03/19/2020    Wt Readings from Last 3 Encounters:  08/15/23 190 lb (86.2 kg)  05/22/23 190 lb (86.2 kg)  05/09/23 190 lb (86.2 kg)    GEN: Well nourished, well developed in no acute distress; *** NECK: No JVD; No carotid bruits CARDIAC: Normal S1, S2; RRR, no murmurs, rubs, gallops RESPIRATORY:  Clear to auscultation without rales, wheezing or rhonchi ; nonlabored, good air movement. ABDOMEN: Soft, non-tender, non-distended EXTREMITIES:  No edema; No deformity      ASSESSMENT AND PLAN: .    Problem List Items Addressed This Visit   None    Assessment and Plan                 {Are you ordering a CV Procedure (e.g. stress test, cath, DCCV, TEE, etc)?   Press F2        :[id number]}   Follow-Up: No follow-ups on file.  Total time spent: *** min spent with patient + *** min spent charting = ***  min      Signed, Marykay Lex, MD, MS Bryan Lemma, M.D., M.S. Interventional Cardiologist  [facility]  Pager # [phone] Phone # [phone] [address] [place], [place] [address]

## 2023-10-24 ENCOUNTER — Ambulatory Visit: Payer: 59 | Admitting: Cardiology

## 2023-10-24 DIAGNOSIS — R079 Chest pain, unspecified: Secondary | ICD-10-CM

## 2023-11-28 ENCOUNTER — Ambulatory Visit: Payer: 59 | Admitting: Pulmonary Disease

## 2023-11-28 ENCOUNTER — Encounter: Payer: Self-pay | Admitting: Pulmonary Disease

## 2023-11-28 VITALS — BP 137/81 | HR 68 | Temp 97.7°F | Ht 62.0 in | Wt 194.0 lb

## 2023-11-28 DIAGNOSIS — R0602 Shortness of breath: Secondary | ICD-10-CM

## 2023-11-28 DIAGNOSIS — K219 Gastro-esophageal reflux disease without esophagitis: Secondary | ICD-10-CM

## 2023-11-28 DIAGNOSIS — R079 Chest pain, unspecified: Secondary | ICD-10-CM

## 2023-11-28 NOTE — Patient Instructions (Signed)
We will schedule you for an echocardiogram of your heart  We will refer you to [facility] for further evaluation of your reflux  Continue pantoprazole and famotidine  Follow up in 3 months with pulmonary function tests

## 2023-11-28 NOTE — Progress Notes (Unsigned)
Synopsis: Referred in December 2024 for dyspnea on exertion  Subjective:   PATIENT ID: Pamela Moreno GENDER: female DOB: [birth year], MRN: [id number]   HPI  Chief Complaint  Patient presents with   Follow-up   Pamela Moreno is a 47 year old woman, never smoker with history of GERD who is referred to pulmonary clinic for dyspnea on exertion.   The patient, with a history of COVID-19 and reflux disease, presents with chest pain that radiates to the head and arm, followed by episodes of passing out. The chest pain is described as a burning sensation, and is severe enough to cause the patient to pass out for a few seconds to minutes. The patient reports that these episodes have been occurring since her COVID-19 infection in 2020, and while the frequency has decreased, she still occurs unpredictably. The patient also reports shortness of breath, particularly when talking, but does not experience exhaustion during physical activity. The patient's reflux disease, which has been present for over ten years, is controlled with Protonix and famotidine. The patient also reports numbness in the lips and arms at night, but has not been diagnosed with any neurological conditions. The patient denies any alcohol use, smoking, or vaping, and is currently working as a Advertising account planner.  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, did not complete full tx   New onset headache    Paresthesias    Vitamin D deficiency      Family History  Problem Relation Age of Onset   Cancer Mother    Hypertension Father    Hyperlipidemia Father      Social History   Socioeconomic History   Marital status: Married    Spouse name: Lars Mage   Number of children: 3   Years of education: Not on file   Highest education level: Not on file  Occupational History   Not on file   Tobacco Use   Smoking status: Never   Smokeless tobacco: Never  Vaping Use   Vaping status: Never Used  Substance and Sexual Activity   Alcohol use: Never   Drug use: Never   Sexual activity: Yes    Birth control/protection: Surgical  Other Topics Concern   Not on file  Social History Narrative   Father lives with her.  Moved from [place] age 58   Lives with spouse   Social Determinants of Health   Financial Resource Strain: Not on file  Food Insecurity: Not on file  Transportation Needs: Not on file  Physical Activity: Not on file  Stress: Not on file  Social Connections: Not on file  Intimate Partner Violence: Not on file     Allergies  Allergen Reactions   Wasp Venom     Other Reaction(s): Hives, breathing issues, hives/itching/difficulty breathing   Penicillins Itching and Other (See Comments)    Amoxicillin, cleocin, augmentin-rash   Sulfa Antibiotics Itching and Other (See Comments)   Amoxicillin Rash   Amoxicillin-Pot Clavulanate Rash   Clindamycin Rash   Clindamycin Hcl Rash   Sulfamethoxazole Rash     Outpatient Medications Prior to Visit  Medication Sig Dispense Refill   atorvastatin (LIPITOR) 20 MG tablet Take 20 mg by mouth daily.     benzonatate (TESSALON) 200 MG capsule  cetirizine (ZYRTEC) 10 MG tablet Take 1 tablet by mouth daily.     clarithromycin (BIAXIN) 500 MG tablet TAKE 1 TABLET BY MOUTH EVERY 12 HOURS FOR 14 DAYS     clindamycin (CLEOCIN) 300 MG capsule Take 1 capsule by mouth 3 (three) times daily.     clobetasol ointment (TEMOVATE) 0.05 % APPLY A THIN LAYER TO THE AFFECTED AREA(S) BY TOPICAL ROUTE 2 TIMES PER DAY X 2 WEEKS     dexamethasone (DECADRON) 6 MG tablet      diclofenac Sodium (PENNSAID) 2 % SOLN APPLY 2 PUMPS (40 MG) TO THE AFFECTED AREA BY TOPICAL ROUTE 2 TIMES PER DAY     esomeprazole (NEXIUM) 40 MG capsule      famotidine (PEPCID) 40 MG tablet Take 40 mg by mouth at bedtime as needed.     HYDROcodone bit-homatropine  (HYDROMET) 5-1.5 MG/5ML syrup TAKE 5 ML BY MOUTH EVERY 6 HOURS AS NEEDED FOR COUGH     meloxicam (MOBIC) 15 MG tablet TK 1 T PO QD     metroNIDAZOLE (FLAGYL) 250 MG tablet Take 250 mg by mouth 3 (three) times daily.     nitrofurantoin, macrocrystal-monohydrate, (MACROBID) 100 MG capsule TK 1 C PO BID FOR 7 DAYS     omeprazole (PRILOSEC) 10 MG capsule Take by mouth.     pantoprazole (PROTONIX) 40 MG tablet 1 tablet Orally Once a day     polyethylene glycol-electrolytes (NULYTELY) 420 g solution MIX AND DRINK AS DIRECTED     predniSONE (DELTASONE) 10 MG tablet TK 1 T TID FOR 2. TK 1 T BID FOR 5 DAYS AND TK 1 T QD TILL FINISHED     predniSONE (DELTASONE) 20 MG tablet TAKE 1 TABLET BY MOUTH EVERY MORNING FOR 5 DAYS     predniSONE (DELTASONE) 5 MG tablet 1 tab tid x 2 days, 1 tab bid x 5 days, 1 tab qd till finished     triamcinolone cream (KENALOG) 0.5 % APPLY TOPICALLY TO THE AFFECTED AREA TWICE DAILY     No facility-administered medications prior to visit.    Review of Systems  Constitutional:  Negative for chills, fever, malaise/fatigue and weight loss.  HENT:  Negative for congestion, sinus pain and sore throat.   Eyes: Negative.   Respiratory:  Negative for cough, hemoptysis, sputum production, shortness of breath and wheezing.   Cardiovascular:  Positive for chest pain and palpitations. Negative for orthopnea, claudication and leg swelling.  Gastrointestinal:  Positive for heartburn. Negative for abdominal pain, nausea and vomiting.  Genitourinary: Negative.   Musculoskeletal:  Negative for joint pain and myalgias.  Skin:  Positive for itching. Negative for rash.  Neurological:  Positive for headaches. Negative for weakness.  Endo/Heme/Allergies: Negative.   Psychiatric/Behavioral:  The patient is nervous/anxious.     Objective:   Vitals:   11/28/23 1350  BP: 137/81  Pulse: 68  Temp: 97.7 F (36.5 C)  TempSrc: Temporal  SpO2: 100%  Weight: 194 lb (88 kg)  Height: 5\' 2"   (1.575 m)   Physical Exam Constitutional:      General: She is not in acute distress.    Appearance: Normal appearance.  Eyes:     General: No scleral icterus.    Conjunctiva/sclera: Conjunctivae normal.  Cardiovascular:     Rate and Rhythm: Normal rate and regular rhythm.  Pulmonary:     Breath sounds: No wheezing, rhonchi or rales.  Musculoskeletal:     Right lower leg: No edema.     Left  lower leg: No edema.  Skin:    General: Skin is warm and dry.  Neurological:     General: No focal deficit present.     CBC    Component Value Date/Time   WBC 6.4 08/15/2023 1101   RBC 4.47 08/15/2023 1101   HGB 13.2 08/15/2023 1101   HCT 39.7 08/15/2023 1101   PLT 166 08/15/2023 1101   MCV 88.8 08/15/2023 1101   MCH 29.5 08/15/2023 1101   MCHC 33.2 08/15/2023 1101   RDW 12.7 08/15/2023 1101   LYMPHSABS 2.6 08/15/2023 1101   MONOABS 0.5 08/15/2023 1101   EOSABS 0.1 08/15/2023 1101   BASOSABS 0.1 08/15/2023 1101      Latest Ref Rng & Units 08/15/2023   11:01 AM 11/16/2022   10:18 PM 03/24/2020   10:55 AM  BMP  Glucose 70 - 99 mg/dL 89  784  696   BUN 6 - 20 mg/dL 10  16  13    Creatinine 0.44 - 1.00 mg/dL 2.95  2.84  1.32   Sodium 135 - 145 mmol/L 141  137  139   Potassium 3.5 - 5.1 mmol/L 3.6  3.2  3.5   Chloride 98 - 111 mmol/L 107  107  108   CO2 22 - 32 mmol/L 24  24  20    Calcium 8.9 - 10.3 mg/dL 9.5  9.1  9.0     Chest imaging: CXR 08/15/23 1. Enlarged cardiac silhouette and pulmonary vascular congestion, most consistent with mild congestive heart failure. No overt edema at this time.  PFT:     No data to display          Labs:  Path:  Echo:  Heart Catheterization:    Assessment & Plan:   No diagnosis found.  Discussion: Deyaneira Baumhover is a 47 year old woman, never smoker with history of GERD who is referred to pulmonary clinic for dyspnea on exertion.   Her presentation of symptoms is not entirely clear. I believe she has severe reflux that is causing  significant pain which is leading to these episodes of altered mentation. These episodes do not sound like true syncope. Cardiac workup in 2021/2022 for similar presentation was negative for ischemic causes.  Chest Pain Episodes of chest pain radiating to the head and arm, followed by syncope. Episodes have been occurring since COVID-19 infection in 2019, with the most recent episode a month ago. No clear triggers identified, but episodes sometimes occur when lying on the left side. No clear cardiac etiology identified in previous workup. -Refer to GI for evaluation of potential reflux-related etiology. -Consider obtaining home blood pressure monitor to evaluate for potential hypertensive episodes. - Check echocardiogram given enlarged heart and vascular congestion on recent chest x-ray, rule out pulmonary hypertension.  Gastroesophageal Reflux Disease (GERD) Long-standing history of GERD, currently managed with pantoprazole and famotidine. Episodes of chest pain may be related to reflux. -Refer to GI for further evaluation, including potential endoscopy. -Consider increasing pantoprazole to twice daily dosing.  Shortness of Breath Reports shortness of breath, particularly when talking. Improvement noted recently. History of COVID-19 infection in 2019. No current use of prednisone or inhalers. -Schedule follow-up in 3 months with breathing tests. -Consider potential role of anxiety, given recent bereavement.  Potential Sleep Apnea Reports snoring and occasional nocturnal awakenings. No reports of gasping for air or observed apneas. -Consider further evaluation for sleep apnea if symptoms persist or worsen.  Follow up in 3 months with PFTs.  Melody Comas, MD [facility] Office: [phone]   Current Outpatient Medications:    atorvastatin (LIPITOR) 20 MG tablet, Take 20 mg by mouth daily., Disp: , Rfl:    benzonatate (TESSALON) 200 MG capsule, , Disp: , Rfl:     cetirizine (ZYRTEC) 10 MG tablet, Take 1 tablet by mouth daily., Disp: , Rfl:    clarithromycin (BIAXIN) 500 MG tablet, TAKE 1 TABLET BY MOUTH EVERY 12 HOURS FOR 14 DAYS, Disp: , Rfl:    clindamycin (CLEOCIN) 300 MG capsule, Take 1 capsule by mouth 3 (three) times daily., Disp: , Rfl:    clobetasol ointment (TEMOVATE) 0.05 %, APPLY A THIN LAYER TO THE AFFECTED AREA(S) BY TOPICAL ROUTE 2 TIMES PER DAY X 2 WEEKS, Disp: , Rfl:    dexamethasone (DECADRON) 6 MG tablet, , Disp: , Rfl:    diclofenac Sodium (PENNSAID) 2 % SOLN, APPLY 2 PUMPS (40 MG) TO THE AFFECTED AREA BY TOPICAL ROUTE 2 TIMES PER DAY, Disp: , Rfl:    esomeprazole (NEXIUM) 40 MG capsule, , Disp: , Rfl:    famotidine (PEPCID) 40 MG tablet, Take 40 mg by mouth at bedtime as needed., Disp: , Rfl:    HYDROcodone bit-homatropine (HYDROMET) 5-1.5 MG/5ML syrup, TAKE 5 ML BY MOUTH EVERY 6 HOURS AS NEEDED FOR COUGH, Disp: , Rfl:    meloxicam (MOBIC) 15 MG tablet, TK 1 T PO QD, Disp: , Rfl:    metroNIDAZOLE (FLAGYL) 250 MG tablet, Take 250 mg by mouth 3 (three) times daily., Disp: , Rfl:    nitrofurantoin, macrocrystal-monohydrate, (MACROBID) 100 MG capsule, TK 1 C PO BID FOR 7 DAYS, Disp: , Rfl:    omeprazole (PRILOSEC) 10 MG capsule, Take by mouth., Disp: , Rfl:    pantoprazole (PROTONIX) 40 MG tablet, 1 tablet Orally Once a day, Disp: , Rfl:    polyethylene glycol-electrolytes (NULYTELY) 420 g solution, MIX AND DRINK AS DIRECTED, Disp: , Rfl:    predniSONE (DELTASONE) 10 MG tablet, TK 1 T TID FOR 2. TK 1 T BID FOR 5 DAYS AND TK 1 T QD TILL FINISHED, Disp: , Rfl:    predniSONE (DELTASONE) 20 MG tablet, TAKE 1 TABLET BY MOUTH EVERY MORNING FOR 5 DAYS, Disp: , Rfl:    predniSONE (DELTASONE) 5 MG tablet, 1 tab tid x 2 days, 1 tab bid x 5 days, 1 tab qd till finished, Disp: , Rfl:    triamcinolone cream (KENALOG) 0.5 %, APPLY TOPICALLY TO THE AFFECTED AREA TWICE DAILY, Disp: , Rfl:

## 2023-11-29 ENCOUNTER — Telehealth (HOSPITAL_BASED_OUTPATIENT_CLINIC_OR_DEPARTMENT_OTHER): Payer: Self-pay | Admitting: Pulmonary Disease

## 2023-11-29 NOTE — Telephone Encounter (Signed)
Left voicemail to schedule ECHO. Asked patient to give [place] a call back at [phone]

## 2023-12-03 DIAGNOSIS — N3001 Acute cystitis with hematuria: Secondary | ICD-10-CM | POA: Diagnosis not present

## 2023-12-12 DIAGNOSIS — R202 Paresthesia of skin: Secondary | ICD-10-CM | POA: Diagnosis not present

## 2023-12-12 DIAGNOSIS — R0600 Dyspnea, unspecified: Secondary | ICD-10-CM | POA: Diagnosis not present

## 2023-12-12 DIAGNOSIS — K219 Gastro-esophageal reflux disease without esophagitis: Secondary | ICD-10-CM | POA: Diagnosis not present

## 2023-12-12 DIAGNOSIS — M7989 Other specified soft tissue disorders: Secondary | ICD-10-CM | POA: Diagnosis not present

## 2023-12-12 DIAGNOSIS — R7303 Prediabetes: Secondary | ICD-10-CM | POA: Diagnosis not present

## 2023-12-15 ENCOUNTER — Other Ambulatory Visit: Payer: Self-pay | Admitting: Family Medicine

## 2023-12-15 DIAGNOSIS — M7989 Other specified soft tissue disorders: Secondary | ICD-10-CM

## 2023-12-25 NOTE — Progress Notes (Signed)
 Cardiology Office Note:    Date:  12/26/2023   ID:  P[place], DOB 1976-06-25, MRN [id number]  PCP:  Pamela Channel, MD  Cardiologist:  None  Electrophysiologist:  None   Referring MD: Pamela Channel, MD   Chief Complaint  Patient presents with   Chest Pain    History of Present Illness:    P[place] is a 48 y.o. female with a hx of hyperlipidemia, GERD who is referred by Dr. Teresa for evaluation of dyspnea on exertion.  She saw Dr. Lavona 04/2020 for chest pain.  ETT 06/04/2020 showed good exercise capacity (10.1 METS), no evidence of ischemia.  She was seen by Dr. Kara in pulmonology for shortness of breath 11/2022, recommended PFTs and echocardiogram.  She reports she has been having intermittent chest pain.  Described as burning across upper chest and radiates to left arm.  Typically can last for few minutes but if persists longer she starts to feel like she is going to pass out.  Can happen at rest but does report can occur with exertion, states that she had episode when exerting herself heavily while walking at [employer].  Also reports shortness of breath, particularly when she talks.  States that during near syncopal episodes will feel lightheaded and then have tunnel vision and feel nauseous and diaphoretic.  She denies any palpitations.  Denies any lower extremity edema.  No smoking history.  No known family history of heart disease.    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   benzonatate  (TESSALON ) 200 MG capsule     cetirizine (ZYRTEC) 10 MG tablet Take 1 tablet by mouth daily.   famotidine (PEPCID) 40 MG tablet Take 40 mg by mouth at bedtime as needed.   pantoprazole (PROTONIX) 40 MG tablet 1 tablet Orally Once a day   triamcinolone cream (KENALOG) 0.5 % APPLY TOPICALLY TO THE AFFECTED AREA TWICE DAILY     Allergies:   Wasp venom, Penicillins, Sulfa antibiotics, Amoxicillin, Amoxicillin-pot clavulanate, Clindamycin, Clindamycin hcl, and Sulfamethoxazole   Social History   Socioeconomic History   Marital status: Married    Spouse name: [place]ela [place]   Number of children: 3   Years of education: Not on file   Highest education level: Not on file  Occupational History   Not on file  Tobacco Use   Smoking status: Never   Smokeless tobacco: Never  Vaping Use   Vaping status: Never Used  Substance and Sexual Activity   Alcohol use: Never   Drug use: Never   Sexual activity: Yes    Birth control/protection: Surgical  Other Topics Concern   Not on file  Social History Narrative   Father lives with her.  Moved from [place] age 34   Lives with spouse   Social Drivers of Corporate Investment Banker Strain: Not on file  Food Insecurity: Not on  file  Transportation Needs: Not on file  Physical Activity: Not on file  Stress: Not on file  Social Connections: Not on file     Family History: The patient's family history includes Cancer in her mother; Hyperlipidemia in her father; Hypertension in her father.  ROS:   Please see the history of present illness.     All other systems reviewed and are negative.  EKGs/Labs/Other Studies Reviewed:    The following studies were reviewed today:   EKG:  12/26/23: Sinus bradycardia, rate 53  Recent Labs: 08/15/2023: B Natriuretic Peptide 72.7; BUN 10; Creatinine, Ser 0.68; Hemoglobin 13.2; Platelets 166; Potassium 3.6; Sodium 141  Recent Lipid Panel No results found for: CHOL, TRIG, HDL, CHOLHDL, VLDL, LDLCALC, LDLDIRECT  Physical Exam:     VS:  BP 114/80 (BP Location: Right Arm, Patient Position: Sitting, Cuff Size: Normal)   Pulse (!) 53   Ht 5' 2 (1.575 m)   Wt 193 lb (87.5 kg)   LMP 03/19/2020   SpO2 97%   BMI 35.30 kg/m     Wt Readings from Last 3 Encounters:  12/26/23 193 lb (87.5 kg)  11/28/23 194 lb (88 kg)  08/15/23 190 lb (86.2 kg)     GEN:  Well nourished, well developed in no acute distress HEENT: Normal NECK: No JVD; No carotid bruits LYMPHATICS: No lymphadenopathy CARDIAC: RRR, no murmurs, rubs, gallops RESPIRATORY:  Clear to auscultation without rales, wheezing or rhonchi  ABDOMEN: Soft, non-tender, non-distended MUSCULOSKELETAL:  No edema; No deformity  SKIN: Warm and dry NEUROLOGIC:  Alert and oriented x 3 PSYCHIATRIC:  Normal affect   ASSESSMENT:    1. Chest pain of uncertain etiology   2. Encounter to establish care   3. Precordial pain   4. Near syncope   5. Hyperlipidemia, unspecified hyperlipidemia type    PLAN:    Chest pain/Dyspnea: Reports atypical chest pain but does report can occur with exertion, suggesting possible angina.  Does have CAD risk factor (hyperlipidemia).  ETT 06/04/2020 showed good exercise capacity (10.1 METS), no evidence of ischemia. -Echo ordered, scheduled 12/28/2023 -Recommend coronary CTA to evaluate for obstructive CAD.  Resting heart rate in 50s, will not give Lopressor  prior to study  Near syncope: Suspect vasovagal syncope given description of prodromal symptoms.  Recommend echocardiogram to rule out structural heart disease as above.  Recommend Zio patch x 2 weeks to evaluate for arrhythmia as cause of her symptoms  Hyperlipidemia: On atorvastatin  20 mg daily.  Check lipid panel  RTC in 6 months   Medication Adjustments/Labs and Tests Ordered: Current medicines are reviewed at length with the patient today.  Concerns regarding medicines are outlined above.  Orders Placed This Encounter  Procedures   CT CORONARY MORPH W/CTA COR W/SCORE W/CA W/CM  &/OR WO/CM   Basic Metabolic Panel (BMET)   Lipid panel   LONG TERM MONITOR (3-14 DAYS)   EKG 12-Lead   No orders of the defined types were placed in this encounter.   Patient Instructions  Medication Instructions:  Continue current medications *If you need a refill on your cardiac medications before your next appointment, please call your pharmacy*   Lab Work: BMET, Lipid today If you have labs (blood work) drawn today and your tests are completely normal, you will receive your results only by: MyChart Message (if you have MyChart) OR A paper copy in the mail If you have any lab test that is abnormal or we need to change your treatment, we will call  you to review the results.   Testing/Procedures: Zio ZIO XT- Long Term Monitor Instructions  Your physician has requested you wear a ZIO patch monitor for 14 days.  This is a single patch monitor. [employer] supplies one patch monitor per enrollment. Additional stickers are not available. Please do not apply patch if you will be having a Nuclear Stress Test,  Echocardiogram, Cardiac CT, MRI, or Chest Xray during the period you would be wearing the  monitor. The patch cannot be worn during these tests. You cannot remove and re-apply the  ZIO XT patch monitor.  Your ZIO patch monitor will be mailed 3 day USPS to your address on file. It may take 3-5 days  to receive your monitor after you have been enrolled.  Once you have received your monitor, please review the enclosed instructions. Your monitor  has already been registered assigning a specific monitor serial # to you.  Billing and Patient Assistance Program Information  We have supplied [employer] with any of your insurance information on file for billing purposes. [employer] offers a sliding scale Patient Assistance Program for patients that do not have  insurance, or whose insurance does not completely cover the cost of the ZIO monitor.  You must apply for the Patient Assistance  Program to qualify for this discounted rate.  To apply, please call [employer] at [phone], select option 4, select option 2, ask to apply for  Patient Assistance Program. Meredeth will ask your household income, and how many people  are in your household. They will quote your out-of-pocket cost based on that information.  [employer] will also be able to set up a 80-month, interest-free payment plan if needed.  Applying the monitor   Shave hair from upper left chest.  Hold abrader disc by orange tab. Rub abrader in 40 strokes over the upper left chest as  indicated in your monitor instructions.  Clean area with 4 enclosed alcohol pads. Let dry.  Apply patch as indicated in monitor instructions. Patch will be placed under collarbone on left  side of chest with arrow pointing upward.  Rub patch adhesive wings for 2 minutes. Remove white label marked 1. Remove the white  label marked 2. Rub patch adhesive wings for 2 additional minutes.  While looking in a mirror, press and release button in center of patch. A small green light will  flash 3-4 times. This will be your only indicator that the monitor has been turned on.  Do not shower for the first 24 hours. You may shower after the first 24 hours.  Press the button if you feel a symptom. You will hear a small click. Record Date, Time and  Symptom in the Patient Logbook.  When you are ready to remove the patch, follow instructions on the last 2 pages of Patient  Logbook. Stick patch monitor onto the last page of Patient Logbook.  Place Patient Logbook in the blue and white box. Use locking tab on box and tape box closed  securely. The blue and white box has prepaid postage on it. Please place it in the mailbox as  soon as possible. Your physician should have your test results approximately 7 days after the  monitor has been mailed back to [facility].  Call [facility] Customer Care at [phone] if you have questions regarding   your ZIO XT patch monitor. Call them immediately if you see an orange light blinking on your  monitor.  If your monitor falls off in less than 4  days, contact our Monitor department at [phone].  If your monitor becomes loose or falls off after 4 days call [employer] at [phone] for  suggestions on securing your monitor    Follow-Up: At [facility], you and your health needs are our priority.  As part of our continuing mission to provide you with exceptional heart care, we have created designated Provider Care Teams.  These Care Teams include your primary Cardiologist (physician) and Advanced Practice Providers (APPs -  Physician Assistants and Nurse Practitioners) who all work together to provide you with the care you need, when you need it.  We recommend signing up for the patient portal called MyChart.  Sign up information is provided on this After Visit Summary.  MyChart is used to connect with patients for Virtual Visits (Telemedicine).  Patients are able to view lab/test results, encounter notes, upcoming appointments, etc.  Non-urgent messages can be sent to your provider as well.   To learn more about what you can do with MyChart, go to [email or web address].    Your next appointment:   6 month(s)  Provider:   Dr. Kate  Other Instructions   Your cardiac CT will be scheduled at one of the below locations:   [facility] [address] [place], [place] [address] [phone]   If scheduled at [facility], please arrive at the [facility] and Children's Entrance (Entrance C2) of [facility] 30 minutes prior to test start time. You can use the FREE valet parking offered at entrance C (encouraged to control the heart rate for the test)  Proceed to the [facility] Radiology Department (first floor) to check-in and test prep.  All radiology patients and guests should use entrance C2 at [facility], accessed from [facility], even though the hospital's physical address listed is [address]    There is spacious parking and easy access to the radiology department from the [facility] Heart and Vascular entrance. Please enter here and check-in with the desk attendant.   Please follow these instructions carefully (unless otherwise directed):  On the Night Before the Test: Be sure to Drink plenty of water. Do not consume any caffeinated/decaffeinated beverages or chocolate 12 hours prior to your test. Do not take any antihistamines 12 hours prior to your test. Drink plenty of water until 1 hour prior to the test. Do not eat any food 1 hour prior to test. You may take your regular medications prior to the test.  Patients who wear a continuous glucose monitor MUST remove the device prior to scanning. FEMALES- please wear underwire-free bra if available, avoid dresses & tight clothing  Drink plenty of water. After receiving IV contrast, you may experience a mild flushed feeling. This is normal. On occasion, you may experience a mild rash up to 24 hours after the test. This is not dangerous. If this occurs, you can take Benadryl  25 mg and increase your fluid intake. If you experience trouble breathing, this can be serious. If it is severe call 911 IMMEDIATELY. If it is mild, please call our office.  We will call to schedule your test 2-4 weeks out understanding that some insurance companies will need an authorization prior to the service being performed.   For more information and frequently asked questions, please visit our website : [email or web address]  For non-scheduling related questions, please contact the cardiac imaging nurse navigator should you have any questions/concerns: Cardiac Imaging Nurse Navigators Direct Office Dial: [phone]  For scheduling needs, including cancellations and rescheduling, please call Brittany, [phone].          Signed, Lonni LITTIE Nanas, MD  12/26/2023 11:25 AM    [facility]

## 2023-12-26 ENCOUNTER — Encounter: Payer: Self-pay | Admitting: Cardiology

## 2023-12-26 ENCOUNTER — Ambulatory Visit: Payer: 59 | Attending: Cardiology

## 2023-12-26 ENCOUNTER — Ambulatory Visit: Payer: 59 | Attending: Cardiology | Admitting: Cardiology

## 2023-12-26 VITALS — BP 114/80 | HR 53 | Ht 62.0 in | Wt 193.0 lb

## 2023-12-26 DIAGNOSIS — Z7689 Persons encountering health services in other specified circumstances: Secondary | ICD-10-CM | POA: Diagnosis not present

## 2023-12-26 DIAGNOSIS — R55 Syncope and collapse: Secondary | ICD-10-CM | POA: Diagnosis not present

## 2023-12-26 DIAGNOSIS — R079 Chest pain, unspecified: Secondary | ICD-10-CM | POA: Diagnosis not present

## 2023-12-26 DIAGNOSIS — E785 Hyperlipidemia, unspecified: Secondary | ICD-10-CM

## 2023-12-26 DIAGNOSIS — R072 Precordial pain: Secondary | ICD-10-CM | POA: Diagnosis not present

## 2023-12-26 NOTE — Patient Instructions (Addendum)
 Medication Instructions:  Continue current medications *If you need a refill on your cardiac medications before your next appointment, please call your pharmacy*   Lab Work: BMET, Lipid today If you have labs (blood work) drawn today and your tests are completely normal, you will receive your results only by: MyChart Message (if you have MyChart) OR A paper copy in the mail If you have any lab test that is abnormal or we need to change your treatment, we will call you to review the results.   Testing/Procedures: Zio ZIO XT- Long Term Monitor Instructions  Your physician has requested you wear a ZIO patch monitor for 14 days.  This is a single patch monitor. [employer] supplies one patch monitor per enrollment. Additional stickers are not available. Please do not apply patch if you will be having a Nuclear Stress Test,  Echocardiogram, Cardiac CT, MRI, or Chest Xray during the period you would be wearing the  monitor. The patch cannot be worn during these tests. You cannot remove and re-apply the  ZIO XT patch monitor.  Your ZIO patch monitor will be mailed 3 day USPS to your address on file. It may take 3-5 days  to receive your monitor after you have been enrolled.  Once you have received your monitor, please review the enclosed instructions. Your monitor  has already been registered assigning a specific monitor serial # to you.  Billing and Patient Assistance Program Information  We have supplied [employer] with any of your insurance information on file for billing purposes. [employer] offers a sliding scale Patient Assistance Program for patients that do not have  insurance, or whose insurance does not completely cover the cost of the ZIO monitor.  You must apply for the Patient Assistance Program to qualify for this discounted rate.  To apply, please call [employer] at [phone], select option 4, select option 2, ask to apply for  Patient Assistance Program. Meredeth will ask your  household income, and how many people  are in your household. They will quote your out-of-pocket cost based on that information.  [employer] will also be able to set up a 72-month, interest-free payment plan if needed.  Applying the monitor   Shave hair from upper left chest.  Hold abrader disc by orange tab. Rub abrader in 40 strokes over the upper left chest as  indicated in your monitor instructions.  Clean area with 4 enclosed alcohol pads. Let dry.  Apply patch as indicated in monitor instructions. Patch will be placed under collarbone on left  side of chest with arrow pointing upward.  Rub patch adhesive wings for 2 minutes. Remove white label marked 1. Remove the white  label marked 2. Rub patch adhesive wings for 2 additional minutes.  While looking in a mirror, press and release button in center of patch. A small green light will  flash 3-4 times. This will be your only indicator that the monitor has been turned on.  Do not shower for the first 24 hours. You may shower after the first 24 hours.  Press the button if you feel a symptom. You will hear a small click. Record Date, Time and  Symptom in the Patient Logbook.  When you are ready to remove the patch, follow instructions on the last 2 pages of Patient  Logbook. Stick patch monitor onto the last page of Patient Logbook.  Place Patient Logbook in the blue and white box. Use locking tab on box and tape box closed  securely. The blue  and white box has prepaid postage on it. Please place it in the mailbox as  soon as possible. Your physician should have your test results approximately 7 days after the  monitor has been mailed back to [facility].  Call [facility] Customer Care at [phone] if you have questions regarding  your ZIO XT patch monitor. Call them immediately if you see an orange light blinking on your  monitor.  If your monitor falls off in less than 4 days, contact our Monitor department at [phone].   If your monitor becomes loose or falls off after 4 days call [employer] at [phone] for  suggestions on securing your monitor    Follow-Up: At [facility], you and your health needs are our priority.  As part of our continuing mission to provide you with exceptional heart care, we have created designated Provider Care Teams.  These Care Teams include your primary Cardiologist (physician) and Advanced Practice Providers (APPs -  Physician Assistants and Nurse Practitioners) who all work together to provide you with the care you need, when you need it.  We recommend signing up for the patient portal called MyChart.  Sign up information is provided on this After Visit Summary.  MyChart is used to connect with patients for Virtual Visits (Telemedicine).  Patients are able to view lab/test results, encounter notes, upcoming appointments, etc.  Non-urgent messages can be sent to your provider as well.   To learn more about what you can do with MyChart, go to [email or web address].    Your next appointment:   6 month(s)  Provider:   Dr. Kate  Other Instructions   Your cardiac CT will be scheduled at one of the below locations:   [facility] [address] [place], [place] [address] [phone]   If scheduled at [facility], please arrive at the [facility] and Children's Entrance (Entrance C2) of [facility] 30 minutes prior to test start time. You can use the FREE valet parking offered at entrance C (encouraged to control the heart rate for the test)  Proceed to the [facility] Radiology Department (first floor) to check-in and test prep.  All radiology patients and guests should use entrance C2 at [facility], accessed from [facility], even though the hospital's physical address listed is [address].    There is spacious parking and easy access to the radiology department from the [facility] Heart and Vascular  entrance. Please enter here and check-in with the desk attendant.   Please follow these instructions carefully (unless otherwise directed):  On the Night Before the Test: Be sure to Drink plenty of water. Do not consume any caffeinated/decaffeinated beverages or chocolate 12 hours prior to your test. Do not take any antihistamines 12 hours prior to your test. Drink plenty of water until 1 hour prior to the test. Do not eat any food 1 hour prior to test. You may take your regular medications prior to the test.  Patients who wear a continuous glucose monitor MUST remove the device prior to scanning. FEMALES- please wear underwire-free bra if available, avoid dresses & tight clothing  Drink plenty of water. After receiving IV contrast, you may experience a mild flushed feeling. This is normal. On occasion, you may experience a mild rash up to 24 hours after the test. This is not dangerous. If this occurs, you can take Benadryl  25 mg and increase your fluid intake. If you experience trouble breathing, this can be  serious. If it is severe call 911 IMMEDIATELY. If it is mild, please call our office.  We will call to schedule your test 2-4 weeks out understanding that some insurance companies will need an authorization prior to the service being performed.   For more information and frequently asked questions, please visit our website : [email or web address]  For non-scheduling related questions, please contact the cardiac imaging nurse navigator should you have any questions/concerns: Cardiac Imaging Nurse Navigators Direct Office Dial: [phone]   For scheduling needs, including cancellations and rescheduling, please call Brittany, [phone].

## 2023-12-26 NOTE — Progress Notes (Unsigned)
 Enrolled for [employer] to mail a ZIO XT long term holter monitor to the patients address on file.

## 2023-12-27 LAB — BASIC METABOLIC PANEL
BUN/Creatinine Ratio: 31 — ABNORMAL HIGH (ref 9–23)
BUN: 17 mg/dL (ref 6–24)
CO2: 23 mmol/L (ref 20–29)
Calcium: 9.1 mg/dL (ref 8.7–10.2)
Chloride: 107 mmol/L — ABNORMAL HIGH (ref 96–106)
Creatinine, Ser: 0.55 mg/dL — ABNORMAL LOW (ref 0.57–1.00)
Glucose: 94 mg/dL (ref 70–99)
Potassium: 4.2 mmol/L (ref 3.5–5.2)
Sodium: 141 mmol/L (ref 134–144)
eGFR: 112 mL/min/{1.73_m2} (ref >59)

## 2023-12-27 LAB — LIPID PANEL
Chol/HDL Ratio: 3 {ratio} (ref 0.0–4.4)
Cholesterol, Total: 179 mg/dL (ref 100–199)
HDL: 60 mg/dL (ref >39)
LDL Chol Calc (NIH): 103 mg/dL — ABNORMAL HIGH (ref 0–99)
Triglycerides: 89 mg/dL (ref 0–149)
VLDL Cholesterol Cal: 16 mg/dL (ref 5–40)

## 2023-12-28 ENCOUNTER — Ambulatory Visit (HOSPITAL_COMMUNITY): Payer: 59 | Attending: Family Medicine

## 2023-12-29 ENCOUNTER — Encounter (HOSPITAL_COMMUNITY): Payer: Self-pay | Admitting: Pulmonary Disease

## 2024-01-06 ENCOUNTER — Telehealth (HOSPITAL_COMMUNITY): Payer: Self-pay | Admitting: *Deleted

## 2024-01-06 ENCOUNTER — Telehealth: Payer: Self-pay | Admitting: *Deleted

## 2024-01-06 NOTE — Telephone Encounter (Signed)
Reaching out to patient to offer assistance regarding upcoming cardiac imaging study; pt verbalizes understanding of appt date/time, parking situation and where to check in, pre-test NPO status and verified current allergies; name and call back number provided for further questions should they arise  Larey Brick RN Navigator Cardiac Imaging [facility] V[facility] [phone] office [phone] cell  Patient is aware she is to arrive at 11:30 AM.

## 2024-01-09 ENCOUNTER — Encounter (HOSPITAL_COMMUNITY): Payer: Self-pay

## 2024-01-09 ENCOUNTER — Ambulatory Visit (HOSPITAL_COMMUNITY)
Admission: RE | Admit: 2024-01-09 | Discharge: 2024-01-09 | Disposition: A | Discharge status: Home / Self Care | Payer: 59 | Source: Ambulatory Visit | Attending: Cardiology | Admitting: Cardiology

## 2024-01-09 DIAGNOSIS — R072 Precordial pain: Secondary | ICD-10-CM | POA: Insufficient documentation

## 2024-01-09 DIAGNOSIS — R079 Chest pain, unspecified: Secondary | ICD-10-CM | POA: Insufficient documentation

## 2024-01-09 MED ORDER — NITROGLYCERIN 0.4 MG SL SUBL
SUBLINGUAL_TABLET | SUBLINGUAL | Status: AC
  Filled 2024-01-09: qty 2

## 2024-01-09 MED ORDER — METOPROLOL TARTRATE 5 MG/5ML IV SOLN: 10.0000 mg | Freq: Once | INTRAVENOUS | Status: DC | PRN

## 2024-01-09 MED ORDER — DILTIAZEM HCL 25 MG/5ML IV SOLN: 10.0000 mg | INTRAVENOUS | Status: DC | PRN

## 2024-01-09 MED ORDER — NITROGLYCERIN 0.4 MG SL SUBL: 0.8000 mg | SUBLINGUAL_TABLET | Freq: Once | SUBLINGUAL | Status: DC

## 2024-01-09 NOTE — Progress Notes (Signed)
After multiple IV attempts by nursing staff, CT, and IV team which where either unsuccessful or infiltrated, pt requests to be scheduled another day. MD Hilty notified.

## 2024-01-13 ENCOUNTER — Telehealth: Payer: Self-pay | Admitting: Cardiology

## 2024-01-13 NOTE — Telephone Encounter (Signed)
Pt is requesting a callback regarding her results and the MRI not being done at hospital due to her stating they couldn't get IV in after trying 4 times and she couldn't take the pain anymore. Please advise

## 2024-01-13 NOTE — Telephone Encounter (Addendum)
Called and spoke to patient. Verified name and DOB. Patient is calling to reschedule her Cardiac CT. She was unable to have Cardiac CT done because they could not get an IV started after 4 attempts. She would like to have rescheduled for [facility] Radiology. Message sent to Cardiac IMG Navigators.

## 2024-01-16 NOTE — Telephone Encounter (Signed)
Called and spoke to patient and Coronary CTA schedule for 2/17 @11 :30. Advised patient to call office for any other questions. Patient verbalized an understanding.

## 2024-01-16 NOTE — Telephone Encounter (Signed)
Patient called and has Cardiac CT Morph rescheduled for 02/06/24 @11 :30. Made patient ware to call office for any other questions. Understanding verbalized.

## 2024-01-18 ENCOUNTER — Emergency Department (HOSPITAL_BASED_OUTPATIENT_CLINIC_OR_DEPARTMENT_OTHER)
Admission: EM | Admit: 2024-01-18 | Discharge: 2024-01-19 | Disposition: A | Discharge status: Home / Self Care | Payer: 59 | Attending: Emergency Medicine | Admitting: Emergency Medicine

## 2024-01-18 ENCOUNTER — Encounter (HOSPITAL_BASED_OUTPATIENT_CLINIC_OR_DEPARTMENT_OTHER): Payer: Self-pay | Admitting: Emergency Medicine

## 2024-01-18 ENCOUNTER — Emergency Department (HOSPITAL_BASED_OUTPATIENT_CLINIC_OR_DEPARTMENT_OTHER): Payer: 59 | Admitting: Radiology

## 2024-01-18 ENCOUNTER — Other Ambulatory Visit: Payer: Self-pay

## 2024-01-18 DIAGNOSIS — R5383 Other fatigue: Secondary | ICD-10-CM | POA: Diagnosis not present

## 2024-01-18 DIAGNOSIS — Z1152 Encounter for screening for COVID-19: Secondary | ICD-10-CM | POA: Diagnosis not present

## 2024-01-18 DIAGNOSIS — R519 Headache, unspecified: Secondary | ICD-10-CM | POA: Diagnosis not present

## 2024-01-18 DIAGNOSIS — R0789 Other chest pain: Secondary | ICD-10-CM | POA: Diagnosis not present

## 2024-01-18 DIAGNOSIS — R079 Chest pain, unspecified: Secondary | ICD-10-CM | POA: Diagnosis not present

## 2024-01-18 DIAGNOSIS — R0602 Shortness of breath: Secondary | ICD-10-CM | POA: Diagnosis not present

## 2024-01-18 LAB — BASIC METABOLIC PANEL
Anion gap: 8 (ref 5–15)
BUN: 18 mg/dL (ref 6–20)
CO2: 24 mmol/L (ref 22–32)
Calcium: 9.6 mg/dL (ref 8.9–10.3)
Chloride: 105 mmol/L (ref 98–111)
Creatinine, Ser: 0.6 mg/dL (ref 0.44–1.00)
GFR, Estimated: >60 mL/min (ref >60)
Glucose, Bld: 112 mg/dL — ABNORMAL HIGH (ref 70–99)
Potassium: 3.7 mmol/L (ref 3.5–5.1)
Sodium: 137 mmol/L (ref 135–145)

## 2024-01-18 LAB — CBC
HCT: 41.6 % (ref 36.0–46.0)
Hemoglobin: 13.5 g/dL (ref 12.0–15.0)
MCH: 28.4 pg (ref 26.0–34.0)
MCHC: 32.5 g/dL (ref 30.0–36.0)
MCV: 87.6 fL (ref 80.0–100.0)
Platelets: 171 10*3/uL (ref 150–400)
RBC: 4.75 MIL/uL (ref 3.87–5.11)
RDW: 13.2 % (ref 11.5–15.5)
WBC: 8 10*3/uL (ref 4.0–10.5)
nRBC: 0 % (ref 0.0–0.2)

## 2024-01-18 LAB — TROPONIN I (HIGH SENSITIVITY)
Troponin I (High Sensitivity): <2 ng/L (ref <18)
Troponin I (High Sensitivity): 2 ng/L (ref <18)

## 2024-01-18 NOTE — ED Triage Notes (Addendum)
CP x several days. SOB. Fatigue, HA.  CTA outpatient in process of being scheduled.

## 2024-01-19 LAB — RESP PANEL BY RT-PCR (RSV, FLU A&B, COVID)  RVPGX2
Influenza A by PCR: NEGATIVE
Influenza B by PCR: NEGATIVE
Resp Syncytial Virus by PCR: NEGATIVE
SARS Coronavirus 2 by RT PCR: NEGATIVE

## 2024-01-19 NOTE — ED Provider Notes (Signed)
[place] EMERGENCY DEPARTMENT AT [facility] Provider Note   CSN: [id number] Arrival date & time: 01/18/24  1936     History  Chief Complaint  Patient presents with   Chest Pain    Ryanne Morand [place] is a 48 y.o. female.  Patient is a 48 year old female with history of GERD, hyperlipidemia, and seasonal allergies.  Patient presenting today with complaints of chest tightness, fatigue, and headache.  This has been worsening over the past several days.  She has been having similar symptoms ongoing for several months.  She has been seen by cardiology and was placed on a long-term event monitor for which she does not know the results.  This evening she began to feel lightheaded and weak and tight in the chest, so presents for evaluation of this.  No fevers or chills.  The history is provided by the patient.       Home Medications Prior to Admission medications   Medication Sig Start Date End Date Taking? Authorizing Provider  atorvastatin (LIPITOR) 20 MG tablet Take 20 mg by mouth daily.    [provider]  benzonatate (TESSALON) 200 MG capsule     [provider]  cetirizine (ZYRTEC) 10 MG tablet Take 1 tablet by mouth daily.    [provider]  famotidine (PEPCID) 40 MG tablet Take 40 mg by mouth at bedtime as needed.    [provider]  pantoprazole (PROTONIX) 40 MG tablet 1 tablet Orally Once a day    [provider]  triamcinolone cream (KENALOG) 0.5 % APPLY TOPICALLY TO THE AFFECTED AREA TWICE DAILY    [provider]      Allergies    Wasp venom, Penicillins, Sulfa antibiotics, Amoxicillin, Amoxicillin-pot clavulanate, Clindamycin, Clindamycin hcl, and Sulfamethoxazole    Review of Systems   Review of Systems  All other systems reviewed and are negative.   Physical Exam Updated Vital Signs BP 132/80   Pulse (!) 46   Temp 98.2 F (36.8 C) (Oral)   Resp 18   Ht 5\' 2"  (1.575 m)   Wt 86.2 kg   LMP 03/19/2020    SpO2 99%   BMI 34.75 kg/m  Physical Exam Vitals and nursing note reviewed.  Constitutional:      General: She is not in acute distress.    Appearance: She is well-developed. She is not diaphoretic.  HENT:     Head: Normocephalic and atraumatic.  Cardiovascular:     Rate and Rhythm: Normal rate and regular rhythm.     Heart sounds: No murmur heard.    No friction rub. No gallop.  Pulmonary:     Effort: Pulmonary effort is normal. No respiratory distress.     Breath sounds: Normal breath sounds. No wheezing.  Abdominal:     General: Bowel sounds are normal. There is no distension.     Palpations: Abdomen is soft.     Tenderness: There is no abdominal tenderness.  Musculoskeletal:        General: Normal range of motion.     Cervical back: Normal range of motion and neck supple.  Skin:    General: Skin is warm and dry.  Neurological:     General: No focal deficit present.     Mental Status: She is alert and oriented to person, place, and time.     ED Results / Procedures / Treatments   Labs (all labs ordered are listed, but only abnormal results are displayed) Labs Reviewed  BASIC  METABOLIC PANEL - Abnormal; Notable for the following components:      Result Value   Glucose, Bld 112 (*)    All other components within normal limits  RESP PANEL BY RT-PCR (RSV, FLU A&B, COVID)  RVPGX2  CBC  TROPONIN I (HIGH SENSITIVITY)  TROPONIN I (HIGH SENSITIVITY)    EKG EKG Interpretation Date/Time:  Wednesday January 18 2024 19:47:19 EST Ventricular Rate:  60 PR Interval:  152 QRS Duration:  84 QT Interval:  440 QTC Calculation: 440 R Axis:   72  Text Interpretation: Normal sinus rhythm Normal ECG When compared with ECG of 26-Dec-2023 10:55, No significant change was found Confirmed by Vonita Moss [phone]) on 01/18/2024 8:55:26 PM  Radiology DG Chest 2 View Result Date: 01/18/2024 CLINICAL DATA:  CP x several days. SOB. Fatigue, HA. EXAM: CHEST - 2 VIEW COMPARISON:  Chest  x-ray 08/15/2023 FINDINGS: The heart and mediastinal contours are within normal limits. No focal consolidation. No pulmonary edema. No pleural effusion. No pneumothorax. No acute osseous abnormality. IMPRESSION: No active cardiopulmonary disease. Electronically Signed   By: Tish Frederickson M.D.   On: 01/18/2024 21:09    Procedures Procedures    Medications Ordered in ED Medications - No data to display  ED Course/ Medical Decision Making/ A&P  Patient is a 48 year old female presenting with chest tightness, shortness of breath, and fatigue as described in the HPI.  Patient arrives here with stable vital signs and is afebrile.  Physical examination is unremarkable.  Laboratory studies obtained including CBC, metabolic panel, troponin x 2, and respiratory panel.  All studies are basically normal.  Chest x-ray is clear.  At this point, patient's workup is completely unremarkable and physical examination shows no focal findings.  Because of her symptoms is unclear, but nothing seems emergent.  I have considered, but highly doubt pulmonary embolism.  Patient's oxygen saturations are 100% and heart rate is in the 50s at the time of my exam.  Patient does have an upcoming coronary CT scheduled and I have advised her to keep this appointment and follow-up the results with her cardiologist/PCP.  Final Clinical Impression(s) / ED Diagnoses Final diagnoses:  None    Rx / DC Orders ED Discharge Orders     None         Geoffery Lyons, MD 01/19/24 [phone]

## 2024-01-19 NOTE — Discharge Instructions (Signed)
Follow-up with your cardiologist/primary doctor in the next week, and return to the ER if your symptoms significantly worsen or change.

## 2024-01-23 ENCOUNTER — Ambulatory Visit
Admission: RE | Admit: 2024-01-23 | Discharge: 2024-01-23 | Disposition: A | Discharge status: Home / Self Care | Payer: 59 | Source: Ambulatory Visit | Attending: Family Medicine | Admitting: Family Medicine

## 2024-01-23 DIAGNOSIS — M7989 Other specified soft tissue disorders: Secondary | ICD-10-CM

## 2024-01-23 DIAGNOSIS — R2231 Localized swelling, mass and lump, right upper limb: Secondary | ICD-10-CM | POA: Diagnosis not present

## 2024-01-23 DIAGNOSIS — R2232 Localized swelling, mass and lump, left upper limb: Secondary | ICD-10-CM | POA: Diagnosis not present

## 2024-01-30 DIAGNOSIS — Z1159 Encounter for screening for other viral diseases: Secondary | ICD-10-CM | POA: Diagnosis not present

## 2024-02-03 ENCOUNTER — Telehealth (HOSPITAL_COMMUNITY): Payer: Self-pay | Admitting: *Deleted

## 2024-02-03 NOTE — Telephone Encounter (Signed)
Reaching out to patient to offer assistance regarding upcoming cardiac imaging study; pt verbalizes understanding of appt date/time, parking situation and where to check in, pre-test NPO status and verified current allergies; name and call back number provided for further questions should they arise  Larey Brick RN Navigator Cardiac Imaging [facility] [phone] office [phone] cell  Patient aware to arrive at 11 AM.

## 2024-02-06 ENCOUNTER — Ambulatory Visit (HOSPITAL_COMMUNITY)
Admission: RE | Admit: 2024-02-06 | Discharge: 2024-02-06 | Disposition: A | Discharge status: Home / Self Care | Payer: 59 | Source: Ambulatory Visit | Attending: Cardiology | Admitting: Cardiology

## 2024-02-06 DIAGNOSIS — R072 Precordial pain: Secondary | ICD-10-CM | POA: Insufficient documentation

## 2024-02-06 DIAGNOSIS — R079 Chest pain, unspecified: Secondary | ICD-10-CM | POA: Diagnosis not present

## 2024-02-06 MED ORDER — METOPROLOL TARTRATE 5 MG/5ML IV SOLN: 10.0000 mg | Freq: Once | INTRAVENOUS | Status: DC | PRN

## 2024-02-06 MED ORDER — IOHEXOL 350 MG/ML SOLN
95.0000 mL | Freq: Once | INTRAVENOUS | Status: AC | PRN
  Administered 2024-02-06: 95 mL via INTRAVENOUS

## 2024-02-06 MED ORDER — DILTIAZEM HCL 25 MG/5ML IV SOLN: 10.0000 mg | INTRAVENOUS | Status: DC | PRN

## 2024-02-06 MED ORDER — NITROGLYCERIN 0.4 MG SL SUBL
0.8000 mg | SUBLINGUAL_TABLET | Freq: Once | SUBLINGUAL | Status: AC
  Administered 2024-02-06: 0.8 mg via SUBLINGUAL

## 2024-02-07 ENCOUNTER — Other Ambulatory Visit: Payer: Self-pay

## 2024-02-07 DIAGNOSIS — E785 Hyperlipidemia, unspecified: Secondary | ICD-10-CM

## 2024-02-07 MED ORDER — ATORVASTATIN CALCIUM 40 MG PO TABS: 40.0000 mg | ORAL_TABLET | Freq: Every day | ORAL | 3 refills | Status: AC

## 2024-02-20 LAB — LIPID PANEL
Chol/HDL Ratio: 2.5 ratio (ref 0.0–4.4)
Cholesterol, Total: 142 mg/dL (ref 100–199)
HDL: 57 mg/dL (ref >39)
LDL Chol Calc (NIH): 64 mg/dL (ref 0–99)
Triglycerides: 115 mg/dL (ref 0–149)
VLDL Cholesterol Cal: 21 mg/dL (ref 5–40)

## 2024-02-27 ENCOUNTER — Ambulatory Visit: Payer: 59 | Admitting: Nurse Practitioner

## 2024-03-26 DIAGNOSIS — Z6834 Body mass index (BMI) 34.0-34.9, adult: Secondary | ICD-10-CM | POA: Diagnosis not present

## 2024-03-26 DIAGNOSIS — Z1231 Encounter for screening mammogram for malignant neoplasm of breast: Secondary | ICD-10-CM | POA: Diagnosis not present

## 2024-03-26 DIAGNOSIS — Z01419 Encounter for gynecological examination (general) (routine) without abnormal findings: Secondary | ICD-10-CM | POA: Diagnosis not present

## 2024-03-26 DIAGNOSIS — N760 Acute vaginitis: Secondary | ICD-10-CM | POA: Diagnosis not present

## 2024-04-13 DIAGNOSIS — L900 Lichen sclerosus et atrophicus: Secondary | ICD-10-CM | POA: Diagnosis not present

## 2024-04-13 DIAGNOSIS — R55 Syncope and collapse: Secondary | ICD-10-CM | POA: Diagnosis not present

## 2024-04-13 DIAGNOSIS — R0789 Other chest pain: Secondary | ICD-10-CM | POA: Diagnosis not present

## 2024-04-13 DIAGNOSIS — R300 Dysuria: Secondary | ICD-10-CM | POA: Diagnosis not present

## 2024-04-16 ENCOUNTER — Ambulatory Visit (INDEPENDENT_AMBULATORY_CARE_PROVIDER_SITE_OTHER): Payer: 59 | Admitting: Nurse Practitioner

## 2024-04-16 ENCOUNTER — Encounter: Payer: Self-pay | Admitting: Nurse Practitioner

## 2024-04-16 VITALS — BP 110/80 | HR 54 | Ht 62.0 in | Wt 187.4 lb

## 2024-04-16 DIAGNOSIS — R0609 Other forms of dyspnea: Secondary | ICD-10-CM | POA: Diagnosis not present

## 2024-04-16 DIAGNOSIS — R0600 Dyspnea, unspecified: Secondary | ICD-10-CM | POA: Diagnosis not present

## 2024-04-16 DIAGNOSIS — G4719 Other hypersomnia: Secondary | ICD-10-CM | POA: Diagnosis not present

## 2024-04-16 DIAGNOSIS — R55 Syncope and collapse: Secondary | ICD-10-CM | POA: Diagnosis not present

## 2024-04-16 DIAGNOSIS — R0683 Snoring: Secondary | ICD-10-CM

## 2024-04-16 DIAGNOSIS — R0602 Shortness of breath: Secondary | ICD-10-CM | POA: Diagnosis not present

## 2024-04-16 DIAGNOSIS — R079 Chest pain, unspecified: Secondary | ICD-10-CM | POA: Diagnosis not present

## 2024-04-16 NOTE — Patient Instructions (Addendum)
 Schedule lung function testing today  Call cardiology to set up your follow up and make sure your echocardiogram gets scheduled - [phone]  Given your symptoms, I am concerned that you may have sleep disordered breathing with sleep apnea. You will need a sleep study for further evaluation. Someone will contact you to schedule this.   We discussed how untreated sleep apnea puts an individual at risk for cardiac arrhthymias, pulm HTN, DM, stroke and increases their risk for daytime accidents. We also briefly reviewed treatment options including weight loss, side sleeping position, oral appliance, CPAP therapy or referral to ENT for possible surgical options  Use caution when driving and pull over if you become sleepy.  Follow up in 6 weeks with Dr. Diania Fortes or Alston Jerry Falesha Schommer,NP to go over sleep study results and PFT. If symptoms do not improve or worsen, please contact office for sooner follow up or seek emergency care.

## 2024-04-16 NOTE — Assessment & Plan Note (Signed)
 She has snoring, daytime sleepiness, PND, restless sleep, morning headaches. BMI 34. Given this,  I am concerned she could have sleep disordered breathing with obstructive sleep apnea. She will need sleep study for further evaluation.    - discussed how weight can impact sleep and risk for sleep disordered breathing - discussed options to assist with weight loss: combination of diet modification, cardiovascular and strength training exercises   - had an extensive discussion regarding the adverse health consequences related to untreated sleep disordered breathing - specifically discussed the risks for hypertension, coronary artery disease, cardiac dysrhythmias, cerebrovascular disease, and diabetes - lifestyle modification discussed   - discussed how sleep disruption can increase risk of accidents, particularly when driving - safe driving practices were discussed

## 2024-04-16 NOTE — Assessment & Plan Note (Addendum)
 No active CP at time of visit. Advised to ensure follow up with cardiology. ED precautions reviewed.

## 2024-04-16 NOTE — Assessment & Plan Note (Signed)
 Unclear etiology. No correlation to exertion. Associated with chest pain/pressure, dizziness, and questionable syncopal episodes. Ongoing for approximately 5 years now. She has not completed previously ordered workup. Advised her we need to schedule PFT for further evaluation. She will also need to follow up with cardiology to ensure echo and cardiac monitoring is scheduled. Cardiac CT without obstructive CAD and visualized lung fields clear. Pending PFT and remaining workup, can decide if HRCT or CT chest necessary.  She does have some associated nocturnal symptoms. Question if untreated OSA is a contributing factor.   Patient Instructions  Schedule lung function testing today  Call cardiology to set up your follow up and make sure your echocardiogram gets scheduled - [phone]  Given your symptoms, I am concerned that you may have sleep disordered breathing with sleep apnea. You will need a sleep study for further evaluation. Someone will contact you to schedule this.   We discussed how untreated sleep apnea puts an individual at risk for cardiac arrhthymias, pulm HTN, DM, stroke and increases their risk for daytime accidents. We also briefly reviewed treatment options including weight loss, side sleeping position, oral appliance, CPAP therapy or referral to ENT for possible surgical options  Use caution when driving and pull over if you become sleepy.  Follow up in 6 weeks with Dr. Diania Fortes or Alston Jerry Sangita Zani,NP to go over sleep study results and PFT. If symptoms do not improve or worsen, please contact office for sooner follow up or seek emergency care.

## 2024-04-16 NOTE — Progress Notes (Signed)
 @Patient  ID: Pamela Moreno, female    DOB: 1976-03-04, 48 y.o.   MRN: [id number]  Chief Complaint  Patient presents with   Follow-up    Sob and pressure on chest, pt states she sometimes feels like she wants to pass out. Pt says her cholesterol medication helped for a little while, but then symptoms started back again    Referring provider: Victorio Grave, MD  HPI: 48 year old female, never smoker followed for shortness of breath. She is a patient of Dr. Reine Caraway and last seen in office 11/28/2023. Past medical history significant for atypical chest pain, HLD, allergic rhinitis, GERD.  TEST/EVENTS:  01/18/2024 CXR: clear lungs  02/06/2024 cardiac CT: visualized lung fields clear  11/28/2023: OV with Dr. Diania Fortes. Hx of COVID. Presents with CP that radiates to the head and arm, followed by episodes of passing out. Have been occurring since her COVID infection in 2020. Frequency has decreased but still occurs unpredictably. GERD feels controlled with protonix and famotidine. She also has numbness in lips and arms at night. No neurological conditions. Presentation is not entirely clear. Cardiac workup in 2021/2022 for similar presentation was negative for ischemic causes. Ordered echo and PFT. Referred to GI to assess if GERD contributing factor.   04/16/2024: Today - follow up Discussed the use of AI scribe software for clinical note transcription with the patient, who gave verbal consent to proceed.  History of Present Illness   Pamela Moreno is a 48 year old female who presents for follow up of ongoing shortness of breath and chest pressure.  She experiences persistent shortness of breath and a sensation of pressure on her chest, which are not related to specific activities. Rare dry cough which is unrelated. In January, she visited the emergency department due to unusual chest discomfort, where a significant workup was performed but did not reveal any issues. She has not yet followed up with cardiology  as planned.  She experiences episodes of lightheadedness and dizziness, particularly when her chest symptoms occur. During these episodes, she has felt pain in her chest, arm, and head, and has occasionally experienced syncope. These symptoms began after she contracted COVID-19 in 2020/2019 and have persisted since then; although becoming less frequent.   No history of asthma, wheezing, or chest tightness. No leg swelling, fevers, chills, night sweats, or hemoptysis. She has difficulty sleeping flat at night due to breathing issues and prefers to sleep on her right side. She sometimes wakes up at night due to these episodes.   She has been told that she snores but has not been informed of any apneic episodes during sleep. She sometimes wakes up with headaches. She does have some daytime fatigue. No sleep parasomnias/paralysis. No issues with drowsy driving or falling asleep while driving.   She did not have PFT or echo previously ordered. Has not seen GI. Cardiology also ordered a cardiac monitor for 14 days, which she hasn't completed either.   Recent cardiac CT with clear visualized lung fields. Calcium  score 0; CAD RADS 1.     Epworth 5  Allergies  Allergen Reactions   Wasp Venom     Other Reaction(s): Hives, breathing issues, hives/itching/difficulty breathing   Penicillins Itching and Other (See Comments)    Amoxicillin, cleocin, augmentin-rash   Sulfa Antibiotics Itching and Other (See Comments)   Amoxicillin Rash   Amoxicillin-Pot Clavulanate Rash   Clindamycin Rash   Clindamycin Hcl Rash   Sulfamethoxazole Rash     There is no  immunization history on file for this patient.  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 90 tablet 3   benzonatate  (TESSALON ) 200 MG capsule      cetirizine (ZYRTEC) 10 MG tablet Take 1 tablet by mouth daily.     famotidine (PEPCID) 40 MG tablet Take 40 mg by mouth at bedtime as needed.     pantoprazole (PROTONIX) 40 MG tablet 1 tablet Orally Once a day     triamcinolone cream (KENALOG) 0.5 % APPLY TOPICALLY TO THE AFFECTED AREA TWICE DAILY     No facility-administered medications prior to visit.     Review of Systems:   Constitutional: No weight loss or gain, night sweats, fevers, chills, or lassitude. +fatigue  HEENT: No difficulty swallowing, tooth/dental problems, or sore throat. No sneezing, itching, ear ache, nasal congestion, or post nasal drip +headaches  CV:  +PND, chest pain, syncope?, dizziness. No orthopnea, swelling in lower extremities, anasarca, palpitations, Resp: +shortness of breath with exertion and at rest; rare dry cough. No excess mucus or change in color of mucus. No hemoptysis. No wheezing.  No chest wall deformity GI:  No heartburn, indigestion, abdominal pain, nausea, vomiting, diarrhea, change in bowel habits, loss of appetite, bloody stools.  GU: No dysuria, change in color of urine, urgency or frequency.  No flank pain, no hematuria  Skin: No rash, lesions, ulcerations MSK:  No joint pain or swelling.   Neuro: No gait abnormalities or memory impairment  Psych: No depression or anxiety. Mood stable.     Physical Exam:  BP 110/80 (BP Location: Right Arm, Patient Position: Sitting)   Pulse (!) 54   Ht 5\' 2"  (1.575 m)   Wt 187 lb 6.4 oz (85 kg)   LMP 03/19/2020   SpO2 96%   BMI 34.28 kg/m   GEN: Pleasant, interactive, well-appearing; obese; in no acute  distress HEENT:  Normocephalic and atraumatic. PERRLA. Sclera white. Nasal turbinates pink, moist and patent bilaterally. No rhinorrhea present. Oropharynx pink and moist, without exudate or edema. No lesions, ulcerations, or postnasal drip. Mallampati III/IV NECK:  Supple w/ fair ROM. No JVD present. Normal carotid impulses w/o bruits. Thyroid symmetrical with no goiter or nodules palpated. No lymphadenopathy.   CV: RRR, no m/r/g, no peripheral edema. Pulses intact, +2 bilaterally. No cyanosis, pallor or clubbing. PULMONARY:  Unlabored, regular breathing. Clear bilaterally A&P w/o wheezes/rales/rhonchi. No accessory muscle use.  GI: BS present and normoactive. Soft, non-tender to palpation. No organomegaly or masses detected.  MSK: No erythema, warmth or tenderness. Cap refil <2 sec all extrem. No deformities or joint swelling noted.  Neuro: A/Ox3. No focal deficits noted.   Skin: Warm, no lesions or rashe Psych: Normal affect and behavior. Judgement and thought content appropriate.     Lab Results:  CBC    Component Value Date/Time  WBC 8.0 01/18/2024 1956   RBC 4.75 01/18/2024 1956   HGB 13.5 01/18/2024 1956   HCT 41.6 01/18/2024 1956   PLT 171 01/18/2024 1956   MCV 87.6 01/18/2024 1956   MCH 28.4 01/18/2024 1956   MCHC 32.5 01/18/2024 1956   RDW 13.2 01/18/2024 1956   LYMPHSABS 2.6 08/15/2023 1101   MONOABS 0.5 08/15/2023 1101   EOSABS 0.1 08/15/2023 1101   BASOSABS 0.1 08/15/2023 1101    BMET    Component Value Date/Time   NA 137 01/18/2024 1956   NA 141 12/26/2023 1142   K 3.7 01/18/2024 1956   CL 105 01/18/2024 1956   CO2 24 01/18/2024 1956   GLUCOSE 112 (H) 01/18/2024 1956   BUN 18 01/18/2024 1956   BUN 17 12/26/2023 1142   CREATININE 0.60 01/18/2024 1956   CALCIUM  9.6 01/18/2024 1956   GFRNONAA >60 01/18/2024 1956   GFRAA >60 03/24/2020 1055    BNP    Component Value Date/Time   BNP 72.7 08/15/2023 1101     Imaging:  No results  found.  Administration History     None           No data to display          No results found for: "NITRICOXIDE"      Assessment & Plan:   DOE (dyspnea on exertion) Unclear etiology. No correlation to exertion. Associated with chest pain/pressure, dizziness, and questionable syncopal episodes. Ongoing for approximately 5 years now. She has not completed previously ordered workup. Advised her we need to schedule PFT for further evaluation. She will also need to follow up with cardiology to ensure echo and cardiac monitoring is scheduled. Cardiac CT without obstructive CAD and visualized lung fields clear. Pending PFT and remaining workup, can decide if HRCT or CT chest necessary.  She does have some associated nocturnal symptoms. Question if untreated OSA is a contributing factor.   Patient Instructions  Schedule lung function testing today  Call cardiology to set up your follow up and make sure your echocardiogram gets scheduled - [phone]  Given your symptoms, I am concerned that you may have sleep disordered breathing with sleep apnea. You will need a sleep study for further evaluation. Someone will contact you to schedule this.   We discussed how untreated sleep apnea puts an individual at risk for cardiac arrhthymias, pulm HTN, DM, stroke and increases their risk for daytime accidents. We also briefly reviewed treatment options including weight loss, side sleeping position, oral appliance, CPAP therapy or referral to ENT for possible surgical options  Use caution when driving and pull over if you become sleepy.  Follow up in 6 weeks with Dr. Diania Fortes or Alston Jerry Joretta Eads,NP to go over sleep study results and PFT. If symptoms do not improve or worsen, please contact office for sooner follow up or seek emergency care.        Chest pain of uncertain etiology No active CP at time of visit. Advised to ensure follow up with cardiology. ED precautions reviewed.  Near  syncope See above. Unclear if this is truly syncope or not. No witnessed episodes. Could consider neurology referral if cardiac/pulmonary workup unrevealing.   Snoring She has snoring, daytime sleepiness, PND, restless sleep, morning headaches. BMI 34. Given this,  I am concerned she could have sleep disordered breathing with obstructive sleep apnea. She will need sleep study for further evaluation.    - discussed how weight can impact sleep and risk for sleep disordered  breathing - discussed options to assist with weight loss: combination of diet modification, cardiovascular and strength training exercises   - had an extensive discussion regarding the adverse health consequences related to untreated sleep disordered breathing - specifically discussed the risks for hypertension, coronary artery disease, cardiac dysrhythmias, cerebrovascular disease, and diabetes - lifestyle modification discussed   - discussed how sleep disruption can increase risk of accidents, particularly when driving - safe driving practices were discussed   Advised if symptoms do not improve or worsen, to please contact office for sooner follow up or seek emergency care.   I spent 35 minutes of dedicated to the care of this patient on the date of this encounter to include pre-visit review of records, face-to-face time with the patient discussing conditions above, post visit ordering of testing, clinical documentation with the electronic health record, making appropriate referrals as documented, and communicating necessary findings to members of the patients care team.  Roetta Clarke, NP 04/16/2024  Pt aware and understands NP's role.

## 2024-04-16 NOTE — Assessment & Plan Note (Signed)
 See above. Unclear if this is truly syncope or not. No witnessed episodes. Could consider neurology referral if cardiac/pulmonary workup unrevealing.

## 2024-05-30 ENCOUNTER — Telehealth: Payer: Self-pay | Admitting: Cardiology

## 2024-05-30 NOTE — Telephone Encounter (Signed)
 Patient would like a call back to discuss monitor results.

## 2024-05-30 NOTE — Telephone Encounter (Signed)
 Called pt, the monitor is not resulted.  Will get message to monitor team to clarify.

## 2024-06-11 ENCOUNTER — Encounter: Payer: Self-pay | Admitting: Cardiology

## 2024-06-11 ENCOUNTER — Ambulatory Visit: Payer: 59 | Attending: Cardiology | Admitting: Cardiology

## 2024-06-11 VITALS — BP 106/70 | HR 61 | Resp 16 | Ht 62.0 in | Wt 186.0 lb

## 2024-06-11 DIAGNOSIS — R55 Syncope and collapse: Secondary | ICD-10-CM

## 2024-06-11 DIAGNOSIS — R079 Chest pain, unspecified: Secondary | ICD-10-CM

## 2024-06-11 DIAGNOSIS — E785 Hyperlipidemia, unspecified: Secondary | ICD-10-CM

## 2024-06-11 NOTE — Patient Instructions (Signed)
 Medication Instructions:  Continue current medications *If you need a refill on your cardiac medications before your next appointment, please call your pharmacy*  Lab Work: none If you have labs (blood work) drawn today and your tests are completely normal, you will receive your results only by: MyChart Message (if you have MyChart) OR A paper copy in the mail If you have any lab test that is abnormal or we need to change your treatment, we will call you to review the results.  Testing/Procedures: Echo  Your physician has requested that you have an echocardiogram. Echocardiography is a painless test that uses sound waves to create images of your heart. It provides your doctor with information about the size and shape of your heart and how well your heart's chambers and valves are working. This procedure takes approximately one hour. There are no restrictions for this procedure. Please do NOT wear cologne, perfume, aftershave, or lotions (deodorant is allowed). Please arrive 15 minutes prior to your appointment time.  Please note: We ask at that you not bring children with you during ultrasound (echo/ vascular) testing. Due to room size and safety concerns, children are not allowed in the ultrasound rooms during exams. Our front office staff cannot provide observation of children in our lobby area while testing is being conducted. An adult accompanying a patient to their appointment will only be allowed in the ultrasound room at the discretion of the ultrasound technician under special circumstances. We apologize for any inconvenience.   Follow-Up: At [facility], you and your health needs are our priority.  As part of our continuing mission to provide you with exceptional heart care, our providers are all part of one team.  This team includes your primary Cardiologist (physician) and Advanced Practice Providers or APPs (Physician Assistants and Nurse Practitioners) who all work  together to provide you with the care you need, when you need it.  Your next appointment:   6 month(s)  Provider:   Dr. Kate  We recommend signing up for the patient portal called MyChart.  Sign up information is provided on this After Visit Summary.  MyChart is used to connect with patients for Virtual Visits (Telemedicine).  Patients are able to view lab/test results, encounter notes, upcoming appointments, etc.  Non-urgent messages can be sent to your provider as well.   To learn more about what you can do with MyChart, go to [email or web address].   Other Instructions Zio  ZIO XT- Long Term Monitor Instructions  Your physician has requested you wear a ZIO patch monitor for 14 days.  This is a single patch monitor. [employer] supplies one patch monitor per enrollment. Additional stickers are not available. Please do not apply patch if you will be having a Nuclear Stress Test,  Echocardiogram, Cardiac CT, MRI, or Chest Xray during the period you would be wearing the  monitor. The patch cannot be worn during these tests. You cannot remove and re-apply the  ZIO XT patch monitor.  Your ZIO patch monitor will be mailed 3 day USPS to your address on file. It may take 3-5 days  to receive your monitor after you have been enrolled.  Once you have received your monitor, please review the enclosed instructions. Your monitor  has already been registered assigning a specific monitor serial # to you.  Billing and Patient Assistance Program Information  We have supplied [employer] with any of your insurance information on file for billing purposes. [employer] offers a sliding scale Patient Assistance Program  for patients that do not have  insurance, or whose insurance does not completely cover the cost of the ZIO monitor.  You must apply for the Patient Assistance Program to qualify for this discounted rate.  To apply, please call [employer] at [phone], select option 4, select option 2, ask to  apply for  Patient Assistance Program. Meredeth will ask your household income, and how many people  are in your household. They will quote your out-of-pocket cost based on that information.  [employer] will also be able to set up a 79-month, interest-free payment plan if needed.  Applying the monitor   Shave hair from upper left chest.  Hold abrader disc by orange tab. Rub abrader in 40 strokes over the upper left chest as  indicated in your monitor instructions.  Clean area with 4 enclosed alcohol pads. Let dry.  Apply patch as indicated in monitor instructions. Patch will be placed under collarbone on left  side of chest with arrow pointing upward.  Rub patch adhesive wings for 2 minutes. Remove white label marked 1. Remove the white  label marked 2. Rub patch adhesive wings for 2 additional minutes.  While looking in a mirror, press and release button in center of patch. A small green light will  flash 3-4 times. This will be your only indicator that the monitor has been turned on.  Do not shower for the first 24 hours. You may shower after the first 24 hours.  Press the button if you feel a symptom. You will hear a small click. Record Date, Time and  Symptom in the Patient Logbook.  When you are ready to remove the patch, follow instructions on the last 2 pages of Patient  Logbook. Stick patch monitor onto the last page of Patient Logbook.  Place Patient Logbook in the blue and white box. Use locking tab on box and tape box closed  securely. The blue and white box has prepaid postage on it. Please place it in the mailbox as  soon as possible. Your physician should have your test results approximately 7 days after the  monitor has been mailed back to [employer].  Call [facility] Customer Care at [phone] if you have questions regarding  your ZIO XT patch monitor. Call them immediately if you see an orange light blinking on your  monitor.  If your monitor falls off in  less than 4 days, contact our Monitor department at [phone].  If your monitor becomes loose or falls off after 4 days call [employer] at [phone] for  suggestions on securing your monitor

## 2024-06-11 NOTE — Progress Notes (Signed)
 Cardiology Office Note:    Date:  06/11/2024   ID:  Pamela Moreno, DOB March 23, 1976, MRN [id number]  PCP:  Teresa Channel, MD  Cardiologist:  None  Electrophysiologist:  None   Referring MD: Teresa Channel, MD   Chief Complaint  Patient presents with   Chest pain of uncertain etiology   Follow-up    6 months    History of Present Illness:    Pamela Moreno is a 48 y.o. female with a hx of hyperlipidemia, GERD who presents for follow-up.  She was referred by Dr. Teresa for evaluation of dyspnea on exertion, initially seen on 12/26/2023.  She saw Dr. Lavona 04/2020 for chest pain.  ETT 06/04/2020 showed good exercise capacity (10.1 METS), no evidence of ischemia.  She was seen by Dr. Kara in pulmonology for shortness of breath 11/2022, recommended PFTs and echocardiogram.  She reports she has been having intermittent chest pain.  Described as burning across upper chest and radiates to left arm.  Typically can last for few minutes but if persists longer she starts to feel like she is going to pass out.  Can happen at rest but does report can occur with exertion, states that she had episode when exerting herself heavily while walking at [employer].  Also reports shortness of breath, particularly when she talks.  States that during near syncopal episodes will feel lightheaded and then have tunnel vision and feel nauseous and diaphoretic.  She denies any palpitations.  Denies any lower extremity edema.  No smoking history.  No known family history of heart disease.  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  Since last clinic visit, she reports she is doing okay.  She continues to have intermittent chest pain.  Reports had episode of syncope last week.  States that during episodes, she will feel chest pain, then feel nauseous and diaphoretic.   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   Cholecalciferol 50 MCG (2000 UT) TABS Take 1 tablet by mouth daily.   EPINEPHrine 0.3 mg/0.3 mL IJ SOAJ injection Inject 0.3 mg into the muscle as needed.   famotidine (PEPCID) 40 MG tablet Take 40 mg by mouth at bedtime as needed.   triamcinolone cream (KENALOG) 0.5 % APPLY TOPICALLY TO THE AFFECTED AREA TWICE DAILY     Allergies:   Wasp venom, Penicillins, Sulfa antibiotics, Amoxicillin, Amoxicillin-pot clavulanate, Clindamycin, Clindamycin hcl, and Sulfamethoxazole   Social History   Socioeconomic History   Marital status: Married    Spouse name: Curlee   Number of children: 3   Years of education: Not on file   Highest education level: Not on file  Occupational History   Not on file  Tobacco Use   Smoking status: Never   Smokeless tobacco: Never  Vaping Use   Vaping status: Never Used  Substance and Sexual Activity  Alcohol use: Never   Drug use: Never   Sexual activity: Yes    Birth control/protection: Surgical  Other Topics Concern   Not on file  Social History Narrative   Father lives with her.  Moved from [place] age 2   Lives with spouse   Social Drivers of Corporate investment banker Strain: Not on file  Food Insecurity: Not on file  Transportation Needs: Not on file  Physical Activity: Not on file  Stress: Not on file  Social Connections: Not on file     Family History: The patient's family history includes Cancer in her mother; Hyperlipidemia in her father; Hypertension in her father.  ROS:   Please see the history of present illness.     All other systems reviewed and are  negative.  EKGs/Labs/Other Studies Reviewed:    The following studies were reviewed today:   EKG:  12/26/23: Sinus bradycardia, rate 53  Recent Labs: 08/15/2023: B Natriuretic Peptide 72.7 01/18/2024: BUN 18; Creatinine, Ser 0.60; Hemoglobin 13.5; Platelets 171; Potassium 3.7; Sodium 137  Recent Lipid Panel    Component Value Date/Time   CHOL 142 02/20/2024 0956   TRIG 115 02/20/2024 0956   HDL 57 02/20/2024 0956   CHOLHDL 2.5 02/20/2024 0956   LDLCALC 64 02/20/2024 0956    Physical Exam:    VS:  BP 106/70 (BP Location: Left Arm, Patient Position: Sitting, Cuff Size: Large)   Pulse 61   Resp 16   Ht 5' 2 (1.575 m)   Wt 186 lb (84.4 kg)   LMP 03/19/2020   SpO2 99%   BMI 34.02 kg/m     Wt Readings from Last 3 Encounters:  06/11/24 186 lb (84.4 kg)  04/16/24 187 lb 6.4 oz (85 kg)  01/18/24 190 lb (86.2 kg)     GEN:  Well nourished, well developed in no acute distress HEENT: Normal NECK: No JVD; No carotid bruits LYMPHATICS: No lymphadenopathy CARDIAC: RRR, no murmurs, rubs, gallops RESPIRATORY:  Clear to auscultation without rales, wheezing or rhonchi  ABDOMEN: Soft, non-tender, non-distended MUSCULOSKELETAL:  No edema; No deformity  SKIN: Warm and dry NEUROLOGIC:  Alert and oriented x 3 PSYCHIATRIC:  Normal affect   ASSESSMENT:    1. Chest pain of uncertain etiology   2. Syncope, unspecified syncope type   3. Hyperlipidemia, unspecified hyperlipidemia type     PLAN:    Chest pain/Dyspnea: Reports atypical chest pain but does report can occur with exertion, suggesting possible angina.  Does have CAD risk factor (hyperlipidemia).  ETT 06/04/2020 showed good exercise capacity (10.1 METS), no evidence of ischemia.  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 -Echo was not done, will reschedule  Syncope: Suspect vasovagal syncope given description of prodromal symptoms.  Recommend echocardiogram to rule out structural heart disease as above.   Recommend Zio patch x 2 weeks to evaluate for arrhythmia as cause of her symptoms; this was ordered at initial clinic visit in January and she reports she turned it in but we did not receive.  Will repeat monitor  Hyperlipidemia: Continue atorvastatin  40 mg daily, LDL 64 on 02/20/2024  RTC in 6 months   Medication Adjustments/Labs and Tests Ordered: Current medicines are reviewed at length with the patient today.  Concerns regarding medicines are outlined above.  Orders Placed This Encounter  Procedures   LONG TERM MONITOR (3-14 DAYS)   ECHOCARDIOGRAM COMPLETE   No orders of the defined types were placed in this encounter.   Patient Instructions  Medication Instructions:  Continue current medications *If you need a refill on your cardiac medications before your next appointment, please call your pharmacy*  Lab Work: none If you have labs (blood work) drawn today and your tests are completely normal, you will receive your results only by: MyChart Message (if you have MyChart) OR A paper copy in the mail If you have any lab test that is abnormal or we need to change your treatment, we will call you to review the results.  Testing/Procedures: Echo  Your physician has requested that you have an echocardiogram. Echocardiography is a painless test that uses sound waves to create images of your heart. It provides your doctor with information about the size and shape of your heart and how well your heart's chambers and valves are working. This procedure takes approximately one hour. There are no restrictions for this procedure. Please do NOT wear cologne, perfume, aftershave, or lotions (deodorant is allowed). Please arrive 15 minutes prior to your appointment time.  Please note: We ask at that you not bring children with you during ultrasound (echo/ vascular) testing. Due to room size and safety concerns, children are not allowed in the ultrasound rooms during exams. Our front office staff  cannot provide observation of children in our lobby area while testing is being conducted. An adult accompanying a patient to their appointment will only be allowed in the ultrasound room at the discretion of the ultrasound technician under special circumstances. We apologize for any inconvenience.   Follow-Up: At [facility], you and your health needs are our priority.  As part of our continuing mission to provide you with exceptional heart care, our providers are all part of one team.  This team includes your primary Cardiologist (physician) and Advanced Practice Providers or APPs (Physician Assistants and Nurse Practitioners) who all work together to provide you with the care you need, when you need it.  Your next appointment:   6 month(s)  Provider:   Dr. Kate  We recommend signing up for the patient portal called MyChart.  Sign up information is provided on this After Visit Summary.  MyChart is used to connect with patients for Virtual Visits (Telemedicine).  Patients are able to view lab/test results, encounter notes, upcoming appointments, etc.  Non-urgent messages can be sent to your provider as well.   To learn more about what you can do with MyChart, go to [email or web address].   Other Instructions Zio  ZIO XT- Long Term Monitor Instructions  Your physician has requested you wear a ZIO patch monitor for 14 days.  This is a single patch monitor. [employer] supplies one patch monitor per enrollment. Additional stickers are not available. Please do not apply patch if you will be having a Nuclear Stress Test,  Echocardiogram, Cardiac CT, MRI, or Chest Xray during the period you would be wearing the  monitor. The patch cannot be worn during these tests. You cannot remove and re-apply the  ZIO XT patch monitor.  Your ZIO patch monitor will be mailed 3 day USPS to your address on file. It may take 3-5 days  to receive your monitor after you have been enrolled.  Once you  have received your monitor, please review the enclosed instructions. Your monitor  has already been registered assigning a specific monitor serial # to you.  Billing and Patient Assistance Program Information  We have supplied [employer] with any of your insurance information on file for billing purposes. [employer] offers a sliding scale Patient Assistance  Program for patients that do not have  insurance, or whose insurance does not completely cover the cost of the ZIO monitor.  You must apply for the Patient Assistance Program to qualify for this discounted rate.  To apply, please call [employer] at [phone], select option 4, select option 2, ask to apply for  Patient Assistance Program. Meredeth will ask your household income, and how many people  are in your household. They will quote your out-of-pocket cost based on that information.  [employer] will also be able to set up a 15-month, interest-free payment plan if needed.  Applying the monitor   Shave hair from upper left chest.  Hold abrader disc by orange tab. Rub abrader in 40 strokes over the upper left chest as  indicated in your monitor instructions.  Clean area with 4 enclosed alcohol pads. Let dry.  Apply patch as indicated in monitor instructions. Patch will be placed under collarbone on left  side of chest with arrow pointing upward.  Rub patch adhesive wings for 2 minutes. Remove white label marked 1. Remove the white  label marked 2. Rub patch adhesive wings for 2 additional minutes.  While looking in a mirror, press and release button in center of patch. A small green light will  flash 3-4 times. This will be your only indicator that the monitor has been turned on.  Do not shower for the first 24 hours. You may shower after the first 24 hours.  Press the button if you feel a symptom. You will hear a small click. Record Date, Time and  Symptom in the Patient Logbook.  When you are ready to remove the patch, follow  instructions on the last 2 pages of Patient  Logbook. Stick patch monitor onto the last page of Patient Logbook.  Place Patient Logbook in the blue and white box. Use locking tab on box and tape box closed  securely. The blue and white box has prepaid postage on it. Please place it in the mailbox as  soon as possible. Your physician should have your test results approximately 7 days after the  monitor has been mailed back to [facility].  Call [facility] Customer Care at [phone] if you have questions regarding  your ZIO XT patch monitor. Call them immediately if you see an orange light blinking on your  monitor.  If your monitor falls off in less than 4 days, contact our Monitor department at [phone].  If your monitor becomes loose or falls off after 4 days call [employer] at [phone] for  suggestions on securing your monitor        Signed, Lonni LITTIE Nanas, MD  06/11/2024 5:12 PM    [facility]

## 2024-06-12 ENCOUNTER — Ambulatory Visit: Attending: Cardiology

## 2024-06-12 DIAGNOSIS — R55 Syncope and collapse: Secondary | ICD-10-CM

## 2024-06-12 NOTE — Progress Notes (Unsigned)
 Enrolled for [employer] to mail a ZIO XT long term holter monitor to the patients address on file.

## 2024-07-09 ENCOUNTER — Encounter

## 2024-07-10 DIAGNOSIS — R55 Syncope and collapse: Secondary | ICD-10-CM | POA: Diagnosis not present

## 2024-07-14 ENCOUNTER — Ambulatory Visit: Payer: Self-pay | Admitting: Cardiology

## 2024-07-14 DIAGNOSIS — R55 Syncope and collapse: Secondary | ICD-10-CM | POA: Diagnosis not present

## 2024-07-16 ENCOUNTER — Ambulatory Visit: Admitting: Pulmonary Disease

## 2024-07-16 ENCOUNTER — Encounter

## 2024-07-16 NOTE — Telephone Encounter (Signed)
 Called and unable to leave voicemail about ZIO results. Patient voicemail not set up.

## 2024-07-19 NOTE — Telephone Encounter (Signed)
 Called and left message that  ZIO results,no significant heart rhythm abnormalities  have been released and to call office for any questions.

## 2024-07-19 NOTE — Telephone Encounter (Signed)
-----   Message from Lonni LITTIE Nanas sent at 07/14/2024  2:55 PM EDT ----- No significant heart rhythm abnormalities ----- Message ----- From: Nanas Lonni LITTIE, MD Sent: 07/14/2024   2:55 PM EDT To: Lonni LITTIE Nanas, MD

## 2024-08-01 ENCOUNTER — Encounter: Payer: Self-pay | Admitting: Family Medicine

## 2024-08-01 ENCOUNTER — Other Ambulatory Visit: Payer: Self-pay | Admitting: Family Medicine

## 2024-08-01 ENCOUNTER — Ambulatory Visit: Payer: Self-pay | Admitting: Family Medicine

## 2024-08-01 VITALS — BP 120/76 | HR 66 | Temp 98.8°F | Ht 62.0 in | Wt 188.0 lb

## 2024-08-01 DIAGNOSIS — I700 Atherosclerosis of aorta: Secondary | ICD-10-CM

## 2024-08-01 DIAGNOSIS — K219 Gastro-esophageal reflux disease without esophagitis: Secondary | ICD-10-CM

## 2024-08-01 DIAGNOSIS — E6609 Other obesity due to excess calories: Secondary | ICD-10-CM

## 2024-08-01 DIAGNOSIS — Z6834 Body mass index (BMI) 34.0-34.9, adult: Secondary | ICD-10-CM

## 2024-08-01 DIAGNOSIS — Z7689 Persons encountering health services in other specified circumstances: Secondary | ICD-10-CM

## 2024-08-01 DIAGNOSIS — E66811 Obesity, class 1: Secondary | ICD-10-CM | POA: Diagnosis not present

## 2024-08-01 MED ORDER — VOQUEZNA 10 MG PO TABS
1.0000 | ORAL_TABLET | Freq: Every day | ORAL | 1 refills | Status: AC
Start: 2024-08-01 — End: ?

## 2024-08-01 NOTE — Progress Notes (Signed)
 I,Pamela Moreno, CMA,acting as a Neurosurgeon for [employer], Pamela Moreno.,have documented all relevant documentation on the behalf of Pamela Moreno, Pamela Moreno,as directed by  Pamela Moreno, Pamela Moreno while in the presence of Pamela Moreno, Pamela Moreno.  Subjective:  Patient ID: Pamela Moreno , female    DOB: 07-02-1976 , 48 y.o.   MRN: [id number]  Chief Complaint  Patient presents with   Establish Care    Patient presents today to establish care.    Gastroesophageal Reflux    Patient presents today for acid reflux. She reports she sometimes it is so bad she passes out. She is currently taking the famotidine. She reports the famotidine helps some.     HPI Discussed the use of AI scribe software for clinical note transcription with the patient, who gave verbal consent to proceed.  History of Present Illness     Pamela Moreno is a 48 year old female who presents to establish care for acid reflux.  She has been experiencing episodes of acid reflux since COVID, characterized by chest pain, arm and head pain, and occasionally syncope when the symptoms subside. These episodes occur unpredictably, sometimes while walking or sleeping, and are more frequent when she sleeps on her back or right side. Watermelon is an immediate trigger for her symptoms.  She has undergone a colonoscopy and endoscopy, both of which were unremarkable. Consultations with a cardiologist and a pulmonologist ruled out cardiac and pulmonary causes for her symptoms. She takes famotidine daily, which provides partial relief, but symptoms persist. She previously tried pantoprazole but found famotidine more effective.  She has a history of high cholesterol and is on cholesterol medication. A CT cardiac scoring revealed cholesterol in her heart arteries, though there was no blockage. She is due for a blood lipid check.  She denies smoking and drinking. She is up to date with her gynecological exams, including a Pap smear and mammogram, though the records are not available in  the current system. 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   Added Voquezna  10 mg to her treatment regimen. She is encouraged to strive for BMI less than 30 to decrease cardiac risk. Advised to aim for at least 150 minutes of exercise per week.    Gastroesophageal Reflux She complains of abdominal pain, chest pain and heartburn. This is a chronic problem. The current episode started more than 1 year ago. The problem occurs frequently. The problem has been waxing and waning. The heartburn is located in the abdomen and substernum. The heartburn is of moderate intensity. Heartburn limits activity: sometimes. Nothing aggravates the symptoms. Associated symptoms include fatigue. She has tried an antacid and a histamine-2 antagonist for the symptoms. The treatment provided mild relief.      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, did not complete full tx   New onset headache    Paresthesias    Vitamin D deficiency      Family History  Problem Relation Age of Onset   Cancer Mother    Hypertension Father    Hyperlipidemia Father      Current Outpatient Medications:    atorvastatin  (LIPITOR) 40 MG tablet, Take 1 tablet (40 mg total) by mouth daily., Disp: 90 tablet, Rfl: 3   Cholecalciferol 50  MCG (2000 UT) TABS, Take 1 tablet by mouth daily., Disp: , Rfl:    EPINEPHrine 0.3 mg/0.3 mL IJ SOAJ injection, Inject 0.3 mg into the muscle as needed., Disp: , Rfl:    famotidine (PEPCID) 40 MG tablet, Take 40 mg by mouth at bedtime as needed., Disp: , Rfl:    triamcinolone cream (KENALOG) 0.5 %, APPLY TOPICALLY TO THE AFFECTED AREA TWICE DAILY, Disp: , Rfl:    Vonoprazan Fumarate  (VOQUEZNA ) 10 MG TABS, Take 1 tablet by mouth daily., Disp: 90 tablet, Rfl: 1   Allergies  Allergen Reactions   Wasp Venom     Other Reaction(s): Hives,  breathing issues, hives/itching/difficulty breathing   Penicillins Itching and Other (See Comments)    Amoxicillin, cleocin, augmentin-rash   Sulfa Antibiotics Itching and Other (See Comments)   Amoxicillin Rash   Amoxicillin-Pot Clavulanate Rash   Clindamycin Rash   Clindamycin Hcl Rash   Sulfamethoxazole Rash     Review of Systems  Constitutional:  Positive for fatigue.  Eyes: Negative.   Respiratory: Negative.    Cardiovascular:  Positive for chest pain.  Gastrointestinal:  Positive for abdominal pain and heartburn.  Musculoskeletal: Negative.   Skin: Negative.   Psychiatric/Behavioral: Negative.       Today's Vitals   08/01/24 1048  BP: 120/76  Pulse: 66  Temp: 98.8 F (37.1 C)  Weight: 188 lb (85.3 kg)  Height: 5' 2 (1.575 m)  PainSc: 0-No pain   Body mass index is 34.39 kg/m.  Wt Readings from Last 3 Encounters:  08/01/24 188 lb (85.3 kg)  06/11/24 186 lb (84.4 kg)  04/16/24 187 lb 6.4 oz (85 kg)    The 10-year ASCVD risk score (Arnett DK, et al., 2019) is: 0.7%   Values used to calculate the score:     Age: 44 years     Clincally relevant sex: Female     Is Non-Hispanic African American: No     Diabetic: No     Tobacco smoker: No     Systolic Blood Pressure: 120 mmHg     Is BP treated: No     HDL Cholesterol: 57 mg/dL     Total Cholesterol: 142 mg/dL  Objective:  Physical Exam HENT:     Head: Normocephalic.  Cardiovascular:     Rate and Rhythm: Normal rate.  Pulmonary:     Effort: Pulmonary effort is normal.  Abdominal:     General: Bowel sounds are normal.  Skin:    General: Skin is warm.  Neurological:     General: No focal deficit present.     Mental Status: She is alert and oriented to person, place, and time.  Psychiatric:        Mood and Affect: Mood normal.        Behavior: Behavior normal.         Assessment And Plan:  Establishing care with new doctor, encounter for  Gastroesophageal reflux disease without  esophagitis Assessment & Plan: Gave samples of Voquezna  10 mg every day for GERD  Orders: -     Voquezna ; Take 1 tablet by mouth daily.  Dispense: 90 tablet; Refill: 1  Class 1 obesity due to excess calories with body mass index (BMI) of 34.0 to 34.9 in adult, unspecified whether serious comorbidity present Assessment & Plan: She is encouraged to strive for BMI less than 30 to decrease cardiac risk. Advised to aim for at least 150 minutes of exercise per week.     Assessment and  Plan Assessment & Plan Gastroesophageal reflux disease Intermittent chest pain, arm pain, and headache linked to GERD, exacerbated by certain foods. Famotidine provides partial relief. Cold water alleviates symptoms. Differential includes food-related triggers or allergies. - Continue famotidine daily. - Advise to monitor and identify food triggers. - Provide samples of Voquezna  10 mg once daily for refractory symptoms.  Coronary artery atherosclerosis CT cardiac scoring showed cholesterol in coronary arteries without significant blockage. Cardiologist recommended cholesterol management to prevent complications. - Continue cholesterol medication as prescribed by cardiologist. - Recheck lipid levels in two months.  Hyperlipidemia Managed with cholesterol medication. Lipid levels normal, but CT showed coronary artery cholesterol deposits, requiring continued management. - Continue cholesterol medication. - Recheck lipid levels in two months.   Return if symptoms worsen or fail to improve.  Patient was given opportunity to ask questions. Patient verbalized understanding of the plan and was able to repeat key elements of the plan. All questions were answered to their satisfaction.    I, Pamela Moreno, Pamela Moreno, have reviewed all documentation for this visit. The documentation on 08/27/24 for the exam, diagnosis, procedures, and orders are all accurate and complete.   IF YOU HAVE BEEN REFERRED TO A SPECIALIST, IT MAY  TAKE 1-2 WEEKS TO SCHEDULE/PROCESS THE REFERRAL. IF YOU HAVE NOT HEARD FROM US /SPECIALIST IN TWO WEEKS, PLEASE GIVE US  A CALL AT [phone] X [phone].

## 2024-08-27 DIAGNOSIS — K219 Gastro-esophageal reflux disease without esophagitis: Secondary | ICD-10-CM | POA: Insufficient documentation

## 2024-08-27 DIAGNOSIS — E6609 Other obesity due to excess calories: Secondary | ICD-10-CM | POA: Insufficient documentation

## 2024-08-27 DIAGNOSIS — I700 Atherosclerosis of aorta: Secondary | ICD-10-CM | POA: Insufficient documentation

## 2024-08-27 DIAGNOSIS — Z7689 Persons encountering health services in other specified circumstances: Secondary | ICD-10-CM | POA: Insufficient documentation

## 2024-08-27 NOTE — Assessment & Plan Note (Signed)
 She is encouraged to strive for BMI less than 30 to decrease cardiac risk. Advised to aim for at least 150 minutes of exercise per week.

## 2024-08-27 NOTE — Assessment & Plan Note (Signed)
 Per CT cardiac scoring; on atorvastatin  40 mg QD

## 2024-08-27 NOTE — Assessment & Plan Note (Signed)
 Gave samples of Voquezna  10 mg every day for GERD

## 2024-09-10 ENCOUNTER — Ambulatory Visit (HOSPITAL_COMMUNITY)
Admission: RE | Admit: 2024-09-10 | Discharge: 2024-09-10 | Disposition: A | Discharge status: Home / Self Care | Source: Ambulatory Visit | Attending: Cardiology | Admitting: Cardiology

## 2024-09-10 DIAGNOSIS — R55 Syncope and collapse: Secondary | ICD-10-CM | POA: Insufficient documentation

## 2024-09-10 LAB — ECHOCARDIOGRAM COMPLETE
Area-P 1/2: 2.93 cm2
S' Lateral: 2.9 cm

## 2024-09-12 ENCOUNTER — Telehealth: Payer: Self-pay | Admitting: *Deleted

## 2024-09-12 NOTE — Telephone Encounter (Signed)
 Called and left patient message on personal cell phone with results of Echo, normal echo. Left message to call office for any questions.

## 2024-10-01 ENCOUNTER — Encounter

## 2024-10-01 ENCOUNTER — Ambulatory Visit: Admitting: Nurse Practitioner

## 2024-10-01 DIAGNOSIS — J40 Bronchitis, not specified as acute or chronic: Secondary | ICD-10-CM | POA: Diagnosis not present

## 2024-10-01 DIAGNOSIS — J069 Acute upper respiratory infection, unspecified: Secondary | ICD-10-CM | POA: Diagnosis not present
